# Patient Record
Sex: Male | Born: 1980
Health system: Southern US, Community
[De-identification: ages and names within clinical notes are randomized; demographics above are authoritative.]

## PROBLEM LIST (undated history)

## (undated) DIAGNOSIS — D751 Secondary polycythemia: Secondary | ICD-10-CM

## (undated) DIAGNOSIS — I10 Essential (primary) hypertension: Secondary | ICD-10-CM

## (undated) HISTORY — PX: DENTAL SURGERY: SHX609

## (undated) HISTORY — DX: Secondary polycythemia: D75.1

---

## 2010-10-03 ENCOUNTER — Emergency Department (HOSPITAL_COMMUNITY): Payer: Self-pay

## 2010-10-03 ENCOUNTER — Emergency Department (HOSPITAL_COMMUNITY)
Admission: EM | Admit: 2010-10-03 | Discharge: 2010-10-03 | Disposition: A | Discharge status: Home / Self Care | Payer: Self-pay | Attending: Emergency Medicine | Admitting: Emergency Medicine

## 2010-10-03 DIAGNOSIS — M79609 Pain in unspecified limb: Secondary | ICD-10-CM | POA: Insufficient documentation

## 2010-10-03 DIAGNOSIS — X58XXXA Exposure to other specified factors, initial encounter: Secondary | ICD-10-CM | POA: Insufficient documentation

## 2010-10-03 DIAGNOSIS — S90129A Contusion of unspecified lesser toe(s) without damage to nail, initial encounter: Secondary | ICD-10-CM | POA: Insufficient documentation

## 2010-10-03 DIAGNOSIS — Y929 Unspecified place or not applicable: Secondary | ICD-10-CM | POA: Insufficient documentation

## 2010-10-03 DIAGNOSIS — M7989 Other specified soft tissue disorders: Secondary | ICD-10-CM | POA: Insufficient documentation

## 2014-05-24 ENCOUNTER — Encounter (HOSPITAL_COMMUNITY): Payer: Self-pay | Admitting: Emergency Medicine

## 2014-05-24 ENCOUNTER — Emergency Department (HOSPITAL_COMMUNITY): Payer: Self-pay

## 2014-05-24 ENCOUNTER — Inpatient Hospital Stay (HOSPITAL_COMMUNITY)
Admission: EM | Admit: 2014-05-24 | Discharge: 2014-06-01 | DRG: 728 | Disposition: A | Discharge status: Home / Self Care | Payer: Self-pay | Attending: Internal Medicine | Admitting: Internal Medicine

## 2014-05-24 DIAGNOSIS — D751 Secondary polycythemia: Secondary | ICD-10-CM | POA: Diagnosis present

## 2014-05-24 DIAGNOSIS — Z823 Family history of stroke: Secondary | ICD-10-CM

## 2014-05-24 DIAGNOSIS — M109 Gout, unspecified: Secondary | ICD-10-CM | POA: Diagnosis present

## 2014-05-24 DIAGNOSIS — L729 Follicular cyst of the skin and subcutaneous tissue, unspecified: Secondary | ICD-10-CM

## 2014-05-24 DIAGNOSIS — D45 Polycythemia vera: Secondary | ICD-10-CM | POA: Diagnosis present

## 2014-05-24 DIAGNOSIS — D471 Chronic myeloproliferative disease: Secondary | ICD-10-CM | POA: Diagnosis present

## 2014-05-24 DIAGNOSIS — M10072 Idiopathic gout, left ankle and foot: Secondary | ICD-10-CM | POA: Diagnosis present

## 2014-05-24 DIAGNOSIS — F17290 Nicotine dependence, other tobacco product, uncomplicated: Secondary | ICD-10-CM | POA: Diagnosis present

## 2014-05-24 DIAGNOSIS — R51 Headache: Secondary | ICD-10-CM | POA: Diagnosis present

## 2014-05-24 DIAGNOSIS — L723 Sebaceous cyst: Secondary | ICD-10-CM | POA: Diagnosis present

## 2014-05-24 DIAGNOSIS — I10 Essential (primary) hypertension: Secondary | ICD-10-CM | POA: Diagnosis present

## 2014-05-24 DIAGNOSIS — Z791 Long term (current) use of non-steroidal anti-inflammatories (NSAID): Secondary | ICD-10-CM

## 2014-05-24 DIAGNOSIS — Z833 Family history of diabetes mellitus: Secondary | ICD-10-CM

## 2014-05-24 DIAGNOSIS — M722 Plantar fascial fibromatosis: Secondary | ICD-10-CM | POA: Diagnosis present

## 2014-05-24 DIAGNOSIS — N492 Inflammatory disorders of scrotum: Principal | ICD-10-CM | POA: Diagnosis present

## 2014-05-24 DIAGNOSIS — M25572 Pain in left ankle and joints of left foot: Secondary | ICD-10-CM

## 2014-05-24 DIAGNOSIS — Z23 Encounter for immunization: Secondary | ICD-10-CM

## 2014-05-24 DIAGNOSIS — C946 Myelodysplastic disease, not classified: Secondary | ICD-10-CM | POA: Diagnosis present

## 2014-05-24 DIAGNOSIS — E860 Dehydration: Secondary | ICD-10-CM | POA: Diagnosis present

## 2014-05-24 HISTORY — DX: Essential (primary) hypertension: I10

## 2014-05-24 LAB — CBC WITH DIFFERENTIAL/PLATELET
BASOS PCT: 1 % (ref 0–1)
Basophils Absolute: 0.2 10*3/uL — ABNORMAL HIGH (ref 0.0–0.1)
EOS PCT: 2 % (ref 0–5)
Eosinophils Absolute: 0.3 10*3/uL (ref 0.0–0.7)
HEMATOCRIT: 67.4 % — AB (ref 39.0–52.0)
HEMOGLOBIN: 22.4 g/dL — AB (ref 13.0–17.0)
LYMPHS ABS: 0.9 10*3/uL (ref 0.7–4.0)
Lymphocytes Relative: 5 % — ABNORMAL LOW (ref 12–46)
MCH: 24.9 pg — ABNORMAL LOW (ref 26.0–34.0)
MCHC: 33.2 g/dL (ref 30.0–36.0)
MCV: 74.8 fL — AB (ref 78.0–100.0)
MONOS PCT: 6 % (ref 3–12)
Monocytes Absolute: 1 10*3/uL (ref 0.1–1.0)
Neutro Abs: 14.8 10*3/uL — ABNORMAL HIGH (ref 1.7–7.7)
Neutrophils Relative %: 86 % — ABNORMAL HIGH (ref 43–77)
PLATELETS: 307 10*3/uL (ref 150–400)
RBC: 9.01 MIL/uL — AB (ref 4.22–5.81)
RDW: 20 % — ABNORMAL HIGH (ref 11.5–15.5)
WBC: 17.2 10*3/uL — AB (ref 4.0–10.5)

## 2014-05-24 LAB — COMPREHENSIVE METABOLIC PANEL
ALT: 26 U/L (ref 0–53)
ANION GAP: 11 (ref 5–15)
AST: 19 U/L (ref 0–37)
Albumin: 2.5 g/dL — ABNORMAL LOW (ref 3.5–5.2)
Alkaline Phosphatase: 158 U/L — ABNORMAL HIGH (ref 39–117)
BILIRUBIN TOTAL: 1 mg/dL (ref 0.3–1.2)
BUN: 11 mg/dL (ref 6–23)
CALCIUM: 8.9 mg/dL (ref 8.4–10.5)
CHLORIDE: 100 meq/L (ref 96–112)
CO2: 26 meq/L (ref 19–32)
CREATININE: 0.9 mg/dL (ref 0.50–1.35)
Glucose, Bld: 176 mg/dL — ABNORMAL HIGH (ref 70–99)
Potassium: 4.6 mEq/L (ref 3.7–5.3)
Sodium: 137 mEq/L (ref 137–147)
Total Protein: 6.5 g/dL (ref 6.0–8.3)

## 2014-05-24 LAB — URINALYSIS, ROUTINE W REFLEX MICROSCOPIC
Bilirubin Urine: NEGATIVE
Glucose, UA: 100 mg/dL — AB
KETONES UR: NEGATIVE mg/dL
LEUKOCYTES UA: NEGATIVE
NITRITE: NEGATIVE
Specific Gravity, Urine: 1.037 — ABNORMAL HIGH (ref 1.005–1.030)
UROBILINOGEN UA: 1 mg/dL (ref 0.0–1.0)
pH: 6 (ref 5.0–8.0)

## 2014-05-24 LAB — URINE MICROSCOPIC-ADD ON

## 2014-05-24 MED ORDER — SODIUM CHLORIDE 0.9 % IV SOLN
1250.0000 mg | Freq: Two times a day (BID) | INTRAVENOUS | Status: DC
  Administered 2014-05-25 – 2014-05-26 (×4): 1250 mg via INTRAVENOUS
  Filled 2014-05-24 (×4): qty 1250

## 2014-05-24 MED ORDER — SODIUM CHLORIDE 0.9 % IJ SOLN: 3.0000 mL | INTRAMUSCULAR | Status: DC | PRN

## 2014-05-24 MED ORDER — PIPERACILLIN-TAZOBACTAM 3.375 G IVPB: 3.3750 g | Freq: Once | INTRAVENOUS | Status: DC

## 2014-05-24 MED ORDER — ACETAMINOPHEN 650 MG RE SUPP: 650.0000 mg | Freq: Four times a day (QID) | RECTAL | Status: DC | PRN

## 2014-05-24 MED ORDER — ACETAMINOPHEN 325 MG PO TABS
650.0000 mg | ORAL_TABLET | Freq: Four times a day (QID) | ORAL | Status: DC | PRN
  Administered 2014-05-25 – 2014-05-30 (×2): 650 mg via ORAL
  Filled 2014-05-24 (×2): qty 2

## 2014-05-24 MED ORDER — SODIUM CHLORIDE 0.9 % IV SOLN: 250.0000 mL | INTRAVENOUS | Status: DC | PRN

## 2014-05-24 MED ORDER — INFLUENZA VAC SPLIT QUAD 0.5 ML IM SUSY
0.5000 mL | PREFILLED_SYRINGE | INTRAMUSCULAR | Status: AC
  Administered 2014-05-25: 0.5 mL via INTRAMUSCULAR
  Filled 2014-05-24 (×2): qty 0.5

## 2014-05-24 MED ORDER — MORPHINE SULFATE 2 MG/ML IJ SOLN: 1.0000 mg | INTRAMUSCULAR | Status: DC | PRN

## 2014-05-24 MED ORDER — HEPARIN SODIUM (PORCINE) 5000 UNIT/ML IJ SOLN
5000.0000 [IU] | Freq: Three times a day (TID) | INTRAMUSCULAR | Status: DC
  Administered 2014-05-24 – 2014-06-01 (×18): 5000 [IU] via SUBCUTANEOUS
  Filled 2014-05-24 (×26): qty 1

## 2014-05-24 MED ORDER — PIPERACILLIN-TAZOBACTAM 3.375 G IVPB
3.3750 g | Freq: Once | INTRAVENOUS | Status: AC
Start: 2014-05-24 — End: 2014-05-24
  Administered 2014-05-24: 3.375 g via INTRAVENOUS
  Filled 2014-05-24: qty 50

## 2014-05-24 MED ORDER — VANCOMYCIN HCL 10 G IV SOLR
1500.0000 mg | Freq: Once | INTRAVENOUS | Status: AC
  Administered 2014-05-24: 1500 mg via INTRAVENOUS
  Filled 2014-05-24: qty 1500

## 2014-05-24 MED ORDER — VANCOMYCIN HCL IN DEXTROSE 1-5 GM/200ML-% IV SOLN
1000.0000 mg | Freq: Once | INTRAVENOUS | Status: AC
  Administered 2014-05-24: 1000 mg via INTRAVENOUS
  Filled 2014-05-24: qty 200

## 2014-05-24 MED ORDER — SODIUM CHLORIDE 0.9 % IJ SOLN
3.0000 mL | Freq: Two times a day (BID) | INTRAMUSCULAR | Status: DC
  Administered 2014-05-26 – 2014-05-30 (×8): 3 mL via INTRAVENOUS
  Administered 2014-05-31: 12:00:00 via INTRAVENOUS
  Administered 2014-05-31 – 2014-06-01 (×2): 3 mL via INTRAVENOUS

## 2014-05-24 MED ORDER — IOHEXOL 300 MG/ML  SOLN
100.0000 mL | Freq: Once | INTRAMUSCULAR | Status: AC | PRN
  Administered 2014-05-24: 100 mL via INTRAVENOUS

## 2014-05-24 MED ORDER — PIPERACILLIN-TAZOBACTAM 3.375 G IVPB
3.3750 g | Freq: Three times a day (TID) | INTRAVENOUS | Status: DC
  Administered 2014-05-25 – 2014-05-28 (×11): 3.375 g via INTRAVENOUS
  Filled 2014-05-24 (×12): qty 50

## 2014-05-24 MED ORDER — OXYCODONE HCL 5 MG PO TABS
5.0000 mg | ORAL_TABLET | ORAL | Status: DC | PRN
  Administered 2014-05-24 – 2014-06-01 (×15): 5 mg via ORAL
  Filled 2014-05-24 (×15): qty 1

## 2014-05-24 NOTE — ED Notes (Signed)
Pt reports swelling and redness at base of testicles, increasing over last three days. Pt suspects an ingrown hair. Denies hx of same

## 2014-05-24 NOTE — ED Notes (Signed)
Patient transported to CT 

## 2014-05-24 NOTE — H&P (Signed)
[facility] History and Physical  Robert Avila XTK:[id number] DOB: 08-23-1980 DOA: 05/24/2014  Referring physician/ED personnel: Irena Cords PCP: No primary provider on file.   Chief Complaint: Scrotal discomfort  HPI: Robert Avila is a 33 y.o. male  Generally healthy with no history of diabetes who presented with platelets above. The problem has been present for the last 2 days. Since onset the problem has gotten worse and now the erythema which started out as what appeared to be an ingrown hair has spread to his scrotum. Nothing he is aware of makes it better direct pressure makes it worse.  He denies any fevers.  ED evaluation after CT scan did not report any abscesses. Given that her problem is mainly cellulitis were consulted for further medical evaluation and recommendations.   Review of Systems:  Constitutional:  No weight loss, night sweats, Fevers, chills, fatigue.  HEENT:  No headaches, Difficulty swallowing,Tooth/dental problems,Sore throat,  No sneezing, itching, ear ache, nasal congestion, post nasal drip,  Cardio-vascular:  No chest pain, Orthopnea, PND, swelling in lower extremities, anasarca, dizziness, palpitations  GI:  No heartburn, indigestion, abdominal pain, nausea, vomiting, diarrhea, change in bowel habits, loss of appetite  Resp:  No shortness of breath with exertion or at rest. No excess mucus, no productive cough, No non-productive cough, No coughing up of blood.No change in color of mucus.No wheezing.No chest wall deformity  Skin:  + rash (at scrotum) or lesions.  GU:  no dysuria, change in color of urine, no urgency or frequency. No flank pain.  Musculoskeletal:  No joint pain or swelling. No decreased range of motion. No back pain.  Psych:  No change in mood or affect. No depression or anxiety. No memory loss.   Past Medical History  Diagnosis Date  . Hypertension    Past Surgical History  Procedure Laterality Date  . Dental surgery      Social History:  reports that he has been smoking.  He does not have any smokeless tobacco history on file. He reports that he drinks alcohol. His drug history is not on file.  No Known Allergies  Family History  Problem Relation Age of Onset  . Diabetes Father      Prior to Admission medications   Medication Sig Start Date End Date Taking? Authorizing Provider  acetaminophen (TYLENOL) 500 MG tablet Take 1,000-1,500 mg by mouth every 6 (six) hours as needed for mild pain.   Yes Historical Provider, MD  DM-Doxylamine-Acetaminophen (VICKS NYQUIL COLD & FLU) 15-6.25-325 MG/15ML LIQD Take 30 mLs by mouth at bedtime as needed (for sleep and cold).   Yes Historical Provider, MD  ibuprofen (ADVIL,MOTRIN) 200 MG tablet Take 600 mg by mouth every 6 (six) hours as needed for moderate pain.   Yes Historical Provider, MD   Physical Exam: Filed Vitals:   05/24/14 1005 05/24/14 1014  BP: 147/97   Pulse: 110 108  Temp: 98.8 F (37.1 C)   TempSrc: Oral   Resp: 20   SpO2: 92% 93%    Wt Readings from Last 3 Encounters:  No data found for Wt    General:  Appears calm and comfortable Eyes: PERRL, normal lids, irises & conjunctiva ENT: grossly normal hearing, lips & tongue Neck: no LAD, masses or thyromegaly Cardiovascular: RRR, no m/r/g. No LE edema. Respiratory: CTA bilaterally, no w/r/r. Normal respiratory effort. Abdomen: soft, nt, nd Skin: Scrotal errythema with pain on palpation. No purulent discharge on evaluation Musculoskeletal: grossly normal tone BUE/BLE Psychiatric: grossly normal mood  and affect, speech fluent and appropriate Neurologic: grossly non-focal.          Labs on Admission:  Basic Metabolic Panel:  Recent Labs Lab 05/24/14 1056  NA 137  K 4.6  CL 100  CO2 26  GLUCOSE 176*  BUN 11  CREATININE 0.90  CALCIUM 8.9   Liver Function Tests:  Recent Labs Lab 05/24/14 1056  AST 19  ALT 26  ALKPHOS 158*  BILITOT 1.0  PROT 6.5  ALBUMIN 2.5*   No  results found for this basename: LIPASE, AMYLASE,  in the last 168 hours No results found for this basename: AMMONIA,  in the last 168 hours CBC:  Recent Labs Lab 05/24/14 1055 05/24/14 1056  WBC 17.2* QUESTIONABLE RESULTS, RECOMMEND RECOLLECT TO VERIFY  NEUTROABS 14.8* QUESTIONABLE RESULTS, RECOMMEND RECOLLECT TO VERIFY  HGB 22.4* QUESTIONABLE RESULTS, RECOMMEND RECOLLECT TO VERIFY  HCT 67.4* QUESTIONABLE RESULTS, RECOMMEND RECOLLECT TO VERIFY  MCV 74.8* QUESTIONABLE RESULTS, RECOMMEND RECOLLECT TO VERIFY  PLT 307 QUESTIONABLE RESULTS, RECOMMEND RECOLLECT TO VERIFY   Cardiac Enzymes: No results found for this basename: CKTOTAL, CKMB, CKMBINDEX, TROPONINI,  in the last 168 hours  BNP (last 3 results) No results found for this basename: PROBNP,  in the last 8760 hours CBG: No results found for this basename: GLUCAP,  in the last 168 hours  Radiological Exams on Admission: Ct Pelvis W Contrast  05/24/2014   CLINICAL DATA:  Scrotal and groin swelling for the past 3 days. Clinical concern for abscess. This began 4 days ago. Some fever and chills.  EXAM: CT PELVIS WITH CONTRAST  TECHNIQUE: Multidetector CT imaging of the pelvis was performed using the standard protocol following the bolus administration of intravenous contrast.  CONTRAST:  161mL OMNIPAQUE IOHEXOL 300 MG/ML  SOLN  COMPARISON:  None.  FINDINGS: Diffuse low density scrotal skin thickening. The testicles and epididymi have normal CT appearances. No discrete fluid collections are seen. No intestinal abnormalities. Normal appearing appendix, urinary bladder and prostate gland. Mildly prominent bilateral inguinal lymph nodes. The largest is on the left, with a short axis diameter of 12 mm on image number 47. This has a preserved fatty hilum. Unremarkable bones.  IMPRESSION: Diffuse scrotal cellulitis without abscess.   Electronically Signed   By: Enrique Sack M.D.   On: 05/24/2014 12:45    Assessment/Plan Active Problems:    Cellulitis of scrotum -  Will start patient on Vancomycin and Zosyn - Monitor WBC levels - blood cultures - No purulent drainage on exam or drainable abscess per CT scan - Med Surg for observation, with improvement in condition will plan on transitioning to oral regimen.   Code Status: full DVT Prophylaxis: Heparin Family Communication: d/c spouse at bedside Disposition Plan: obs, med surg  Time spent: > 35 minutes  Velvet Bathe [facility] Pager [phone]

## 2014-05-24 NOTE — ED Provider Notes (Signed)
MSE was initiated and I personally evaluated the patient and placed orders (if any) at  10:37 AM on May 24, 2014.  The patient appears stable so that the remainder of the MSE may be completed by another provider. Patient presents with acute scrotal swelling and erythema after "popping an ingrown hair" around his perineum yesterday evening. Pain is relieved with lying down, worse with sitting up. No dysuria or penile discharge. Patient will be transferred to main ED for further evaluation and to rule out acute scrotum. Basic labs ordered.        Verl Dicker, PA-C 05/24/14 1040

## 2014-05-24 NOTE — ED Notes (Signed)
Verified with Dr. Wendee Beavers that pt does not need a telemetry bed r/t his tachycardia, pt cleared for med surg bed.

## 2014-05-24 NOTE — ED Provider Notes (Signed)
CSN: [id number]     Arrival date & time 05/24/14  9924 History   First MD Initiated Contact with Patient 05/24/14 1036     Chief Complaint  Patient presents with  . Abscess    pt stated that he has a red , swollen area at base of testicles x 3 days  . Groin Swelling  . Excessive Sweating  . Cellulitis    testicles and groin area     (Consider location/radiation/quality/duration/timing/severity/associated sxs/prior Treatment) HPI Patient presents to the emergency department with swelling and redness of the scrotum and perineal area.  Patient, states, that he had an immigrant here in in the peroneal region that he attempted to squeeze and drain.  Patient, states, some purulent material did drain from the area, but then woke up Saturday morning with increased, redness, swelling and pain to the scrotum and perineal area.  The patient denies chest pain, shortness breath, weakness, dizziness, lightheadedness, fever, or rash, syncope, near syncope, or dysuria.  Patient, states he did not take any medications other than ibuprofen prior to arrival. Past Medical History  Diagnosis Date  . Hypertension    Past Surgical History  Procedure Laterality Date  . Dental surgery     Family History  Problem Relation Age of Onset  . Diabetes Father    History  Substance Use Topics  . Smoking status: Current Some Day Smoker  . Smokeless tobacco: Not on file  . Alcohol Use: Yes     Comment: occ    Review of Systems   All other systems negative except as documented in the HPI. All pertinent positives and negatives as reviewed in the HPI. Allergies  Review of patient's allergies indicates no known allergies.  Home Medications   Prior to Admission medications   Medication Sig Start Date End Date Taking? Authorizing Provider  acetaminophen (TYLENOL) 500 MG tablet Take 1,000-1,500 mg by mouth every 6 (six) hours as needed for mild pain.   Yes Historical Provider, MD  DM-Doxylamine-Acetaminophen  (VICKS NYQUIL COLD & FLU) 15-6.25-325 MG/15ML LIQD Take 30 mLs by mouth at bedtime as needed (for sleep and cold).   Yes Historical Provider, MD  ibuprofen (ADVIL,MOTRIN) 200 MG tablet Take 600 mg by mouth every 6 (six) hours as needed for moderate pain.   Yes Historical Provider, MD   BP 147/97  Pulse 108  Temp(Src) 98.8 F (37.1 C) (Oral)  Resp 20  SpO2 93% Physical Exam  Nursing note and vitals reviewed. Constitutional: He appears well-developed and well-nourished. No distress.  HENT:  Head: Normocephalic and atraumatic.  Mouth/Throat: Oropharynx is clear and moist.  Eyes: Pupils are equal, round, and reactive to light.  Neck: Normal range of motion. Neck supple.  Cardiovascular: Normal rate, regular rhythm, normal heart sounds and intact distal pulses.  Exam reveals no gallop and no friction rub.   No murmur heard. Pulmonary/Chest: Effort normal and breath sounds normal.  Skin: Skin is warm and dry.  Patient has redness and swelling of the scrotum with swelling also in the peroneal region    ED Course  Procedures (including critical care time) Labs Review Labs Reviewed  COMPREHENSIVE METABOLIC PANEL - Abnormal; Notable for the following:    Glucose, Bld 176 (*)    Albumin 2.5 (*)    Alkaline Phosphatase 158 (*)    All other components within normal limits  CBC WITH DIFFERENTIAL - Abnormal; Notable for the following:    WBC 17.2 (*)    RBC 9.01 (*)  Hemoglobin 22.4 (*)    HCT 67.4 (*)    MCV 74.8 (*)    MCH 24.9 (*)    RDW 20.0 (*)    Neutrophils Relative % 86 (*)    Lymphocytes Relative 5 (*)    Neutro Abs 14.8 (*)    Basophils Absolute 0.2 (*)    All other components within normal limits  URINE CULTURE  CBC WITH DIFFERENTIAL  URINALYSIS, ROUTINE W REFLEX MICROSCOPIC    Imaging Review Ct Pelvis W Contrast  05/24/2014   CLINICAL DATA:  Scrotal and groin swelling for the past 3 days. Clinical concern for abscess. This began 4 days ago. Some fever and chills.   EXAM: CT PELVIS WITH CONTRAST  TECHNIQUE: Multidetector CT imaging of the pelvis was performed using the standard protocol following the bolus administration of intravenous contrast.  CONTRAST:  17mL OMNIPAQUE IOHEXOL 300 MG/ML  SOLN  COMPARISON:  None.  FINDINGS: Diffuse low density scrotal skin thickening. The testicles and epididymi have normal CT appearances. No discrete fluid collections are seen. No intestinal abnormalities. Normal appearing appendix, urinary bladder and prostate gland. Mildly prominent bilateral inguinal lymph nodes. The largest is on the left, with a short axis diameter of 12 mm on image number 47. This has a preserved fatty hilum. Unremarkable bones.  IMPRESSION: Diffuse scrotal cellulitis without abscess.   Electronically Signed   By: Enrique Sack M.D.   On: 05/24/2014 12:45    Patient admitted to the hospital for further evaluation and care.  Vancomycin and Zosyn were given.    Brent General, PA-C 05/24/14 1451

## 2014-05-24 NOTE — Progress Notes (Signed)
ANTIBIOTIC CONSULT NOTE - INITIAL  Pharmacy Consult for vancomycin, Zosyn Indication: scrotal cellulitis  No Known Allergies  Patient Measurements: Height: 6\' 1"  (185.4 cm) Weight: 275 lb 6.4 oz (124.921 kg) IBW/kg (Calculated) : 79.9  Vital Signs: Temp: 99.2 F (37.3 C) (10/18 1629) Temp Source: Oral (10/18 1629) BP: 163/88 mmHg (10/18 1629) Pulse Rate: 110 (10/18 1629) Intake/Output from previous day:   Intake/Output from this shift:    Labs:  Recent Labs  05/24/14 1055 05/24/14 1056  WBC 17.2* QUESTIONABLE RESULTS, RECOMMEND RECOLLECT TO VERIFY  HGB 22.4* QUESTIONABLE RESULTS, RECOMMEND RECOLLECT TO VERIFY  PLT 307 QUESTIONABLE RESULTS, RECOMMEND RECOLLECT TO VERIFY  CREATININE  --  0.90   Estimated Creatinine Clearance: 161.7 ml/min (by C-G formula based on Cr of 0.9). No results found for this basename: VANCOTROUGH, VANCOPEAK, VANCORANDOM, GENTTROUGH, GENTPEAK, GENTRANDOM, TOBRATROUGH, TOBRAPEAK, TOBRARND, AMIKACINPEAK, AMIKACINTROU, AMIKACIN,  in the last 72 hours   Microbiology: No results found for this or any previous visit (from the past 720 hour(s)).  Medical History: Past Medical History  Diagnosis Date  . Hypertension     Medications:  Scheduled:  . heparin  5,000 Units Subcutaneous 3 times per day  . [START ON 05/25/2014] Influenza vac split quadrivalent PF  0.5 mL Intramuscular Tomorrow-1000  . piperacillin-tazobactam (ZOSYN)  IV  3.375 g Intravenous Once  . sodium chloride  3 mL Intravenous Q12H   Infusions:    Assessment: 33 yo presented to ER with scrotal discomfort found to have scrotal cellulitis to treat with vancomycin and Zosyn per pharmacy dosing.  10/18 >> vancomycin >> 10/18 >> Zosyn >>   Afebrile  WBC pending  SCr 0.9, CrCl N > 100  Goal of Therapy:  Vancomycin trough level 10-15 mcg/ml  Plan:  1) Vancomycin 1g x 1 given in ER. Will give another 1500mg  IV x 1 to = 2500mg  IV total for loading dose then 2) Vancomycin  1250mg  IV q12 thereafter 3) Zosyn 3.375g IV q8 (extended interval infusion)   Adrian Saran, PharmD, BCPS Pager [phone] 05/24/2014 5:22 PM

## 2014-05-25 DIAGNOSIS — N492 Inflammatory disorders of scrotum: Principal | ICD-10-CM

## 2014-05-25 LAB — CBC
HCT: 71.2 % — ABNORMAL HIGH (ref 39.0–52.0)
HEMATOCRIT: 70.6 % — AB (ref 39.0–52.0)
HEMOGLOBIN: 23.2 g/dL — AB (ref 13.0–17.0)
HEMOGLOBIN: 23 g/dL — AB (ref 13.0–17.0)
MCH: 24.3 pg — AB (ref 26.0–34.0)
MCH: 24.4 pg — ABNORMAL LOW (ref 26.0–34.0)
MCHC: 32.6 g/dL (ref 30.0–36.0)
MCHC: 33.1 g/dL (ref 30.0–36.0)
MCV: 74.6 fL — ABNORMAL LOW (ref 78.0–100.0)
MCV: 73.5 fL — ABNORMAL LOW (ref 78.0–100.0)
Platelets: 269 10*3/uL (ref 150–400)
Platelets: 284 10*3/uL (ref 150–400)
RBC: 9.54 MIL/uL — AB (ref 4.22–5.81)
RBC: 9.5 MIL/uL — ABNORMAL HIGH (ref 4.22–5.81)
RDW: 18.9 % — ABNORMAL HIGH (ref 11.5–15.5)
RDW: 19.9 % — ABNORMAL HIGH (ref 11.5–15.5)
WBC: 14.3 10*3/uL — AB (ref 4.0–10.5)
WBC: 13.6 10*3/uL — AB (ref 4.0–10.5)

## 2014-05-25 NOTE — Progress Notes (Signed)
Lab phoned to state 10/19 CBC results questionable, state they will correct.

## 2014-05-25 NOTE — ED Provider Notes (Signed)
  This was a shared visit with a mid-level provided (NP or PA).  Throughout the patient's course I was available for consultation/collaboration.  I saw the ECG (if appropriate), relevant labs and studies - I agree with the interpretation.  On my exam the patient was in no distress.  The patient had evidence for scrotal cellulitis.  Patient was started on antibiotics, admitted for further evaluation and management.       Carmin Muskrat, MD 05/25/14 [phone]

## 2014-05-25 NOTE — Progress Notes (Signed)
CSW received referral for "other".   CSW spoke with RN who reports that pt needing assistance with medication and does not have insurance or doctor. RNCM aware and will see pt regarding concerns.  Inappropriate CSW referral.  CSW signing off.   Please re-consult if social work needs arise.  Alison Murray, MSW, [place] [facility] [phone]

## 2014-05-25 NOTE — Progress Notes (Signed)
[facility] PROGRESS NOTE  Robert Avila OEU:[id number] DOB: 08-27-1980 DOA: 05/24/2014 PCP: No primary provider on file.  Assessment/Plan: Cellulitis of scrotum  - Will continue Vancomycin and Zosyn  - Monitor WBC levels which are currently trending down - blood cultures pending - No purulent drainage on exam or drainable abscess per CT scan   Polycythemia - The patient may be dehydrated given recent reports of increased use of caffeine products. - Recommend cessation of caffeine, we'll reassess next a.m.   Code Status: full Family Communication: None at bedside Disposition Plan:    Consultants:  None  Procedures:  None  Antibiotics:  As listed above  HPI/Subjective: Patient has no new complaints, feels minimally better  Objective: Filed Vitals:   05/25/14 1409  BP: 145/99  Pulse: 91  Temp: 98.1 F (36.7 C)  Resp: 18    Intake/Output Summary (Last 24 hours) at 05/25/14 1731 Last data filed at 05/25/14 1300  Gross per 24 hour  Intake   1010 ml  Output      0 ml  Net   1010 ml   Filed Weights   05/24/14 1629  Weight: 124.921 kg (275 lb 6.4 oz)    Exam:   General:  Patient in no acute distress, alert and awake  Cardiovascular: Regular rate and rhythm, no murmurs or rubs  Respiratory: Clear to auscultation bilaterally no wheezes  Abdomen: Soft, nondistended, nontender  GU: Scrotum is enlarged and erythematous, no purulent discharge, painful on palpation, no induration or fluctuance  Musculoskeletal: No cyanosis or clubbing  Data Reviewed: Basic Metabolic Panel:  Recent Labs Lab 05/24/14 1056  NA 137  K 4.6  CL 100  CO2 26  GLUCOSE 176*  BUN 11  CREATININE 0.90  CALCIUM 8.9   Liver Function Tests:  Recent Labs Lab 05/24/14 1056  AST 19  ALT 26  ALKPHOS 158*  BILITOT 1.0  PROT 6.5  ALBUMIN 2.5*   No results found for this basename: LIPASE, AMYLASE,  in the last 168 hours No results found for this basename: AMMONIA,   in the last 168 hours CBC:  Recent Labs Lab 05/24/14 1055 05/24/14 1056 05/25/14 0406 05/25/14 0940  WBC 17.2* QUESTIONABLE RESULTS, RECOMMEND RECOLLECT TO VERIFY 14.3* 13.6*  NEUTROABS 14.8* QUESTIONABLE RESULTS, RECOMMEND RECOLLECT TO VERIFY  --   --   HGB 22.4* QUESTIONABLE RESULTS, RECOMMEND RECOLLECT TO VERIFY 23.2* 23.0*  HCT 67.4* QUESTIONABLE RESULTS, RECOMMEND RECOLLECT TO VERIFY 71.2* 70.6*  MCV 74.8* QUESTIONABLE RESULTS, RECOMMEND RECOLLECT TO VERIFY 74.6* 73.5*  PLT 307 QUESTIONABLE RESULTS, RECOMMEND RECOLLECT TO VERIFY 269 284   Cardiac Enzymes: No results found for this basename: CKTOTAL, CKMB, CKMBINDEX, TROPONINI,  in the last 168 hours BNP (last 3 results) No results found for this basename: PROBNP,  in the last 8760 hours CBG: No results found for this basename: GLUCAP,  in the last 168 hours  Recent Results (from the past 240 hour(s))  CULTURE, BLOOD (ROUTINE X 2)     Status: None   Collection Time    05/24/14  4:07 PM      Result Value Ref Range Status   Specimen Description BLOOD RIGHT HAND   Final   Special Requests     Final   Value: BOTTLES DRAWN AEROBIC AND ANAEROBIC 4CC BOTH BOTTLES   Culture  Setup Time     Final   Value: 05/24/2014 18:50     Performed at [facility]     Final  Value:        BLOOD CULTURE RECEIVED NO GROWTH TO DATE CULTURE WILL BE HELD FOR 5 DAYS BEFORE ISSUING A FINAL NEGATIVE REPORT     Performed at [facility]   Report Status PENDING   Incomplete  CULTURE, BLOOD (ROUTINE X 2)     Status: None   Collection Time    05/24/14  4:07 PM      Result Value Ref Range Status   Specimen Description BLOOD RIGHT ARM   Final   Special Requests     Final   Value: BOTTLES DRAWN AEROBIC AND ANAEROBIC 4CC BOTH BOTTLES   Culture  Setup Time     Final   Value: 05/24/2014 18:50     Performed at [facility]   Culture     Final   Value:        BLOOD CULTURE RECEIVED NO GROWTH TO DATE CULTURE WILL BE HELD  FOR 5 DAYS BEFORE ISSUING A FINAL NEGATIVE REPORT     Performed at [facility]   Report Status PENDING   Incomplete     Studies: Ct Pelvis W Contrast  05/24/2014   CLINICAL DATA:  Scrotal and groin swelling for the past 3 days. Clinical concern for abscess. This began 4 days ago. Some fever and chills.  EXAM: CT PELVIS WITH CONTRAST  TECHNIQUE: Multidetector CT imaging of the pelvis was performed using the standard protocol following the bolus administration of intravenous contrast.  CONTRAST:  154mL OMNIPAQUE IOHEXOL 300 MG/ML  SOLN  COMPARISON:  None.  FINDINGS: Diffuse low density scrotal skin thickening. The testicles and epididymi have normal CT appearances. No discrete fluid collections are seen. No intestinal abnormalities. Normal appearing appendix, urinary bladder and prostate gland. Mildly prominent bilateral inguinal lymph nodes. The largest is on the left, with a short axis diameter of 12 mm on image number 47. This has a preserved fatty hilum. Unremarkable bones.  IMPRESSION: Diffuse scrotal cellulitis without abscess.   Electronically Signed   By: Enrique Sack M.D.   On: 05/24/2014 12:45    Scheduled Meds: . heparin  5,000 Units Subcutaneous 3 times per day  . piperacillin-tazobactam (ZOSYN)  IV  3.375 g Intravenous Q8H  . sodium chloride  3 mL Intravenous Q12H  . vancomycin  1,250 mg Intravenous Q12H   Continuous Infusions:   Active Problems:   Cellulitis of scrotum    Time spent: > 25 minutes    Velvet Bathe  [facility] Pager [phone]. If 7PM-7AM, please contact night-coverage at [email or web address], password [facility] 05/25/2014, 5:31 PM  LOS: 1 day

## 2014-05-25 NOTE — Care Management Note (Signed)
CARE MANAGEMENT NOTE 05/25/2014  Patient:  Koehler,Sara   Account Number:  [id number]  Date Initiated:  05/25/2014  Documentation initiated by:  Marney Doctor  Subjective/Objective Assessment:   33 yo admitted with cellulitis of scrotum.     Action/Plan:   From home with wife   Anticipated DC Date:  05/26/2014   Anticipated DC Plan:  [place]  CM consult  [facility]      Choice offered to / List presented to:             Status of service:  In process, will continue to follow Medicare Important Message given?   (If response is "NO", the following Medicare IM given date fields will be blank) Date Medicare IM given:   Medicare IM given by:   Date Additional Medicare IM given:   Additional Medicare IM given by:    Discharge Disposition:    Per UR Regulation:  Reviewed for med. necessity/level of care/duration of stay  If discussed at [place] of Stay Meetings, dates discussed:    Comments:  05/25/14 Marney Doctor RN,BSN,NCM [phone] Met with pt and spouse about DC needs.  Pt has no PCP or insurance.  They are planning to apply for Medicaid.  Gave info on [facility] and told them about their services and potential to help with medication needs if they are scheduled for a follow up appointment.  Pt did not want this CM to make an appointment at this time.  They stated that they could afford DC medications and would think about making an appointment at the [facility].  They were very appreciative of CM assistance.

## 2014-05-25 NOTE — Progress Notes (Signed)
UR completed 

## 2014-05-25 NOTE — Plan of Care (Signed)
Problem: Phase I Progression Outcomes Goal: Hemodynamically stable Outcome: Not Progressing HGB critically high 23.2

## 2014-05-26 LAB — URINE CULTURE
Colony Count: NO GROWTH
Culture: NO GROWTH

## 2014-05-26 LAB — VANCOMYCIN, TROUGH: Vancomycin Tr: 7 ug/mL — ABNORMAL LOW (ref 10.0–20.0)

## 2014-05-26 MED ORDER — VANCOMYCIN HCL 10 G IV SOLR
1500.0000 mg | Freq: Two times a day (BID) | INTRAVENOUS | Status: DC
  Administered 2014-05-27 – 2014-05-28 (×3): 1500 mg via INTRAVENOUS
  Filled 2014-05-26 (×4): qty 1500

## 2014-05-26 MED ORDER — HYDRALAZINE HCL 20 MG/ML IJ SOLN
5.0000 mg | Freq: Once | INTRAMUSCULAR | Status: AC
  Administered 2014-05-26: 5 mg via INTRAVENOUS
  Filled 2014-05-26: qty 1

## 2014-05-26 NOTE — Progress Notes (Signed)
PHARMACY - VANCOMYCIN (brief note)  Day #3 Vancomycin 1250mg  IV q12h for scrotal cellulitis.  Also on Zosyn 3.375gm IV q8h  Vancomycin trough level = 7 mcg/ml (goal 10-15 mcg/ml)  Last dose of Vancomycin 1250mg  charted @ 17:38  Assessment:  Vancomycin SUBtherapeutic on current regimen  PLAN:  Increase Vancomycin to 1500mg  IV q12h with next dose.  Will recheck Vancomycin levels as appropriate.  Leone Haven, PharmD 05/26/14 @ 18:44

## 2014-05-26 NOTE — Progress Notes (Signed)
At 0455 pt's BP was 0455. NP on call Chaney Malling notified and order received for 5 mg of hydralazine which was administered. At 0627 BP 147/102. Schorr NP notified again. Received a repeat order of 5 mg of hydralazine. This RN to continue to monitor. Noreene Larsson RN, BSN

## 2014-05-26 NOTE — Progress Notes (Signed)
ANTIBIOTIC CONSULT NOTE - follow up  Pharmacy Consult for vancomycin, Zosyn Indication: scrotal cellulitis  No Known Allergies  Patient Measurements: Height: 6\' 1"  (185.4 cm) Weight: 275 lb 6.4 oz (124.921 kg) IBW/kg (Calculated) : 79.9  Vital Signs: Temp: 98.7 F (37.1 C) (10/20 0455) Temp Source: Oral (10/20 0455) BP: 147/102 mmHg (10/20 0627) Pulse Rate: 90 (10/20 0627) Intake/Output from previous day: 10/19 0701 - 10/20 0700 In: 530 [P.O.:480; IV Piggyback:50] Out: -  Intake/Output from this shift:    Labs:  Recent Labs  05/24/14 1056 05/25/14 0406 05/25/14 0940  WBC QUESTIONABLE RESULTS, RECOMMEND RECOLLECT TO VERIFY 14.3* 13.6*  HGB QUESTIONABLE RESULTS, RECOMMEND RECOLLECT TO VERIFY 23.2* 23.0*  PLT QUESTIONABLE RESULTS, RECOMMEND RECOLLECT TO VERIFY 269 284  CREATININE 0.90  --   --    Estimated Creatinine Clearance: 161.7 ml/min (by C-G formula based on Cr of 0.9). No results found for this basename: VANCOTROUGH, VANCOPEAK, VANCORANDOM, [place], [place], [place], [place], [place], TOBRARND, AMIKACINPEAK, AMIKACINTROU, AMIKACIN,  in the last 72 hours   Microbiology: Recent Results (from the past 720 hour(s))  CULTURE, BLOOD (ROUTINE X 2)     Status: None   Collection Time    05/24/14  4:07 PM      Result Value Ref Range Status   Specimen Description BLOOD RIGHT HAND   Final   Special Requests     Final   Value: BOTTLES DRAWN AEROBIC AND ANAEROBIC 4CC BOTH BOTTLES   Culture  Setup Time     Final   Value: 05/24/2014 18:50     Performed at [facility]   Culture     Final   Value:        BLOOD CULTURE RECEIVED NO GROWTH TO DATE CULTURE WILL BE HELD FOR 5 DAYS BEFORE ISSUING A FINAL NEGATIVE REPORT     Performed at [facility]   Report Status PENDING   Incomplete  CULTURE, BLOOD (ROUTINE X 2)     Status: None   Collection Time    05/24/14  4:07 PM      Result Value Ref Range Status   Specimen Description BLOOD RIGHT ARM    Final   Special Requests     Final   Value: BOTTLES DRAWN AEROBIC AND ANAEROBIC 4CC BOTH BOTTLES   Culture  Setup Time     Final   Value: 05/24/2014 18:50     Performed at [facility]   Culture     Final   Value:        BLOOD CULTURE RECEIVED NO GROWTH TO DATE CULTURE WILL BE HELD FOR 5 DAYS BEFORE ISSUING A FINAL NEGATIVE REPORT     Performed at [facility]   Report Status PENDING   Incomplete  URINE CULTURE     Status: None   Collection Time    05/24/14  5:59 PM      Result Value Ref Range Status   Specimen Description URINE, CLEAN CATCH   Final   Special Requests NONE   Final   Culture  Setup Time     Final   Value: 05/25/2014 00:16     Performed at [place]     Final   Value: NO GROWTH     Performed at [facility]   Culture     Final   Value: NO GROWTH     Performed at [facility]   Report Status 05/26/2014 FINAL   Final  Medical History: Past Medical History  Diagnosis Date  . Hypertension     Medications:  Scheduled:  . heparin  5,000 Units Subcutaneous 3 times per day  . piperacillin-tazobactam (ZOSYN)  IV  3.375 g Intravenous Q8H  . sodium chloride  3 mL Intravenous Q12H  . vancomycin  1,250 mg Intravenous Q12H   Infusions:    Assessment: 33 yo presented to ER with scrotal discomfort found to have scrotal cellulitis to treat with vancomycin and Zosyn per pharmacy dosing.  10/18 >> vancomycin >> 10/18 >> Zosyn >>   Afebrile  WBC improving  SCr 0.9, CrCl N > 100  Culture: ngtd  Goal of Therapy:  Vancomycin trough level 10-15 mcg/ml  Plan:  1) Continue Vancomycin 1250mg  IV q12 for now 3) Zosyn 3.375g IV q8 (extended interval infusion) 3) Check vanc trough tonight prior to next vanc dose at [place], PharmD, BCPS Pager [phone] 05/26/2014 9:43 AM

## 2014-05-26 NOTE — Progress Notes (Signed)
[facility] PROGRESS NOTE  Robert Avila VVO:[id number] DOB: 20-Oct-1980 DOA: 05/24/2014 PCP: No primary provider on file. Brief Narrative: 33 year old who presented with scrotal cellulitis/edema. Workup was negative for abscess  Assessment/Plan: Cellulitis of scrotum  - Will continue Vancomycin and Zosyn , and improving on this regimen - Monitor WBC levels which are currently trending down, we'll reassess next a.m. - blood cultures pending - No purulent drainage on exam or drainable abscess per CT scan   Polycythemia - The patient may be dehydrated given recent reports of increased use of caffeine products. - Recommend cessation of caffeine, we'll reassess next a.m.   Code Status: full Family Communication: None at bedside Disposition Plan: Discharge within the next one to 2 days with continued improvement   Consultants:  None  Procedures:  None  Antibiotics:  As listed above  HPI/Subjective: Patient complaining of chills. No new complaints otherwise  Objective: Filed Vitals:   05/26/14 1325  BP: 150/97  Pulse: 87  Temp: 98.2 F (36.8 C)  Resp:     Intake/Output Summary (Last 24 hours) at 05/26/14 1431 Last data filed at 05/26/14 1300  Gross per 24 hour  Intake    480 ml  Output      0 ml  Net    480 ml   Filed Weights   05/24/14 1629  Weight: 124.921 kg (275 lb 6.4 oz)    Exam:   General:  Patient in no acute distress, alert and awake  Cardiovascular: Regular rate and rhythm, no murmurs or rubs  Respiratory: Clear to auscultation bilaterally no wheezes  Abdomen: Soft, nondistended, nontender  GU: Scrotum is enlarged and erythematous (improved from admission), no purulent discharge, painful on palpation, no induration or fluctuance  Musculoskeletal: No cyanosis or clubbing  Data Reviewed: Basic Metabolic Panel:  Recent Labs Lab 05/24/14 1056  NA 137  K 4.6  CL 100  CO2 26  GLUCOSE 176*  BUN 11  CREATININE 0.90  CALCIUM 8.9    Liver Function Tests:  Recent Labs Lab 05/24/14 1056  AST 19  ALT 26  ALKPHOS 158*  BILITOT 1.0  PROT 6.5  ALBUMIN 2.5*   No results found for this basename: LIPASE, AMYLASE,  in the last 168 hours No results found for this basename: AMMONIA,  in the last 168 hours CBC:  Recent Labs Lab 05/24/14 1055 05/24/14 1056 05/25/14 0406 05/25/14 0940  WBC 17.2* QUESTIONABLE RESULTS, RECOMMEND RECOLLECT TO VERIFY 14.3* 13.6*  NEUTROABS 14.8* QUESTIONABLE RESULTS, RECOMMEND RECOLLECT TO VERIFY  --   --   HGB 22.4* QUESTIONABLE RESULTS, RECOMMEND RECOLLECT TO VERIFY 23.2* 23.0*  HCT 67.4* QUESTIONABLE RESULTS, RECOMMEND RECOLLECT TO VERIFY 71.2* 70.6*  MCV 74.8* QUESTIONABLE RESULTS, RECOMMEND RECOLLECT TO VERIFY 74.6* 73.5*  PLT 307 QUESTIONABLE RESULTS, RECOMMEND RECOLLECT TO VERIFY 269 284   Cardiac Enzymes: No results found for this basename: CKTOTAL, CKMB, CKMBINDEX, TROPONINI,  in the last 168 hours BNP (last 3 results) No results found for this basename: PROBNP,  in the last 8760 hours CBG: No results found for this basename: GLUCAP,  in the last 168 hours  Recent Results (from the past 240 hour(s))  CULTURE, BLOOD (ROUTINE X 2)     Status: None   Collection Time    05/24/14  4:07 PM      Result Value Ref Range Status   Specimen Description BLOOD RIGHT HAND   Final   Special Requests     Final   Value: BOTTLES DRAWN AEROBIC AND ANAEROBIC 4CC  BOTH BOTTLES   Culture  Setup Time     Final   Value: 05/24/2014 18:50     Performed at [facility]   Culture     Final   Value:        BLOOD CULTURE RECEIVED NO GROWTH TO DATE CULTURE WILL BE HELD FOR 5 DAYS BEFORE ISSUING A FINAL NEGATIVE REPORT     Performed at [facility]   Report Status PENDING   Incomplete  CULTURE, BLOOD (ROUTINE X 2)     Status: None   Collection Time    05/24/14  4:07 PM      Result Value Ref Range Status   Specimen Description BLOOD RIGHT ARM   Final   Special Requests     Final    Value: BOTTLES DRAWN AEROBIC AND ANAEROBIC 4CC BOTH BOTTLES   Culture  Setup Time     Final   Value: 05/24/2014 18:50     Performed at [facility]   Culture     Final   Value:        BLOOD CULTURE RECEIVED NO GROWTH TO DATE CULTURE WILL BE HELD FOR 5 DAYS BEFORE ISSUING A FINAL NEGATIVE REPORT     Performed at [facility]   Report Status PENDING   Incomplete  URINE CULTURE     Status: None   Collection Time    05/24/14  5:59 PM      Result Value Ref Range Status   Specimen Description URINE, CLEAN CATCH   Final   Special Requests NONE   Final   Culture  Setup Time     Final   Value: 05/25/2014 00:16     Performed at [place]     Final   Value: NO GROWTH     Performed at [facility]   Culture     Final   Value: NO GROWTH     Performed at [facility]   Report Status 05/26/2014 FINAL   Final     Studies: No results found.  Scheduled Meds: . heparin  5,000 Units Subcutaneous 3 times per day  . piperacillin-tazobactam (ZOSYN)  IV  3.375 g Intravenous Q8H  . sodium chloride  3 mL Intravenous Q12H  . vancomycin  1,250 mg Intravenous Q12H   Continuous Infusions:   Active Problems:   Cellulitis of scrotum    Time spent: > 25 minutes    Velvet Bathe  [facility] Pager [phone]. If 7PM-7AM, please contact night-coverage at [email or web address], password [facility] 05/26/2014, 2:31 PM  LOS: 2 days

## 2014-05-27 ENCOUNTER — Inpatient Hospital Stay (HOSPITAL_COMMUNITY): Payer: Self-pay

## 2014-05-27 DIAGNOSIS — Z720 Tobacco use: Secondary | ICD-10-CM

## 2014-05-27 DIAGNOSIS — D751 Secondary polycythemia: Secondary | ICD-10-CM

## 2014-05-27 LAB — CBC
HEMATOCRIT: 66.4 % — AB (ref 39.0–52.0)
Hemoglobin: 21.2 g/dL (ref 13.0–17.0)
MCH: 23.8 pg — ABNORMAL LOW (ref 26.0–34.0)
MCHC: 31.9 g/dL (ref 30.0–36.0)
MCV: 74.6 fL — ABNORMAL LOW (ref 78.0–100.0)
PLATELETS: 306 10*3/uL (ref 150–400)
RBC: 8.9 MIL/uL — ABNORMAL HIGH (ref 4.22–5.81)
RDW: 18.7 % — ABNORMAL HIGH (ref 11.5–15.5)
WBC: 11.2 10*3/uL — AB (ref 4.0–10.5)

## 2014-05-27 LAB — RETICULOCYTES
RBC.: 8.62 MIL/uL — AB (ref 4.22–5.81)
RETIC COUNT ABSOLUTE: 56 10*3/uL (ref 19.0–186.0)
Retic Ct Pct: 1.3 % (ref 0.4–3.1)

## 2014-05-27 LAB — SAVE SMEAR

## 2014-05-27 LAB — SEDIMENTATION RATE: Sed Rate: 3 mm/hr (ref 0–16)

## 2014-05-27 MED ORDER — SODIUM CHLORIDE 0.9 % IV SOLN
INTRAVENOUS | Status: AC
  Administered 2014-05-28: 09:00:00 via INTRAVENOUS

## 2014-05-27 MED ORDER — ASPIRIN 81 MG PO CHEW
81.0000 mg | CHEWABLE_TABLET | Freq: Every day | ORAL | Status: DC
  Administered 2014-05-27 – 2014-06-01 (×6): 81 mg via ORAL
  Filled 2014-05-27 (×6): qty 1

## 2014-05-27 MED ORDER — OXYCODONE HCL 5 MG PO TABS
5.0000 mg | ORAL_TABLET | Freq: Once | ORAL | Status: AC
  Administered 2014-05-27: 5 mg via ORAL
  Filled 2014-05-27: qty 1

## 2014-05-27 NOTE — Progress Notes (Signed)
[facility] PROGRESS NOTE  Robert Avila HWT:[id number] DOB: 1981-06-21 DOA: 05/24/2014 PCP: No primary provider on file.  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 Patient denies any headache, visual symptoms, urticaria or rash, abdominal or joint pains. Chest x-rays and LFTs normal. UA unremarkable. Check ESR Spoke with Dr Alvy Bimler who recommended adding low dose ASA ovoid risk for stroke and phlebotomy. Order for phlebotomy per IV team. Monitor CBC closely in am   Scrotal  cellulitis No signs of abscess or hematoma on imaging. Pain and swelling has been improving as her patient. Continue vancomycin and Zosyn. Transition to oral antibiotic upon discharge. Pain control with when necessary morphine and oxycodone.    Code Status: Full code Family Communication: Wife at bedside  Disposition Plan: Home once improved   Consultants:  Hematology ( Dr Alvy Bimler)  Procedures:   phlebotomy  Antibiotics:  IV vancomycin and Zosyn 10/18--  HPI/Subjective: Patient reports some pain in his scrotum. Denies any other symptoms.  Objective: Filed Vitals:   05/27/14 1400  BP: 150/90  Pulse: 90  Temp: 98.2 F (36.8 C)  Resp:     Intake/Output Summary (Last 24 hours) at 05/27/14 1602 Last data filed at 05/27/14 1300  Gross per 24 hour  Intake   1200 ml  Output      0 ml  Net   1200 ml   Filed Weights   05/24/14 1629  Weight: 124.921 kg (275 lb 6.4 oz)    Exam:   General:  Middle aged male in no acute distress  HEENT: No pallor, moist oral mucosa  Chest: Clear to auscultation bilaterally  CVS: Normal S1-S2, no murmurs  Abdomen: Soft, nondistended, nontender, no hepatosplenomegaly  Remedies: Warm, no edema, no calf swelling or tenderness, no petechiae or rash  CNS: Alert and oriented    Data Reviewed: Basic Metabolic Panel:  Recent Labs Lab 05/24/14 1056  NA  137  K 4.6  CL 100  CO2 26  GLUCOSE 176*  BUN 11  CREATININE 0.90  CALCIUM 8.9   Liver Function Tests:  Recent Labs Lab 05/24/14 1056  AST 19  ALT 26  ALKPHOS 158*  BILITOT 1.0  PROT 6.5  ALBUMIN 2.5*   No results found for this basename: LIPASE, AMYLASE,  in the last 168 hours No results found for this basename: AMMONIA,  in the last 168 hours CBC:  Recent Labs Lab 05/24/14 1055 05/24/14 1056 05/25/14 0406 05/25/14 0940 05/27/14  WBC 17.2* QUESTIONABLE RESULTS, RECOMMEND RECOLLECT TO VERIFY 14.3* 13.6* 11.2*  NEUTROABS 14.8* QUESTIONABLE RESULTS, RECOMMEND RECOLLECT TO VERIFY  --   --   --   HGB 22.4* QUESTIONABLE RESULTS, RECOMMEND RECOLLECT TO VERIFY 23.2* 23.0* 21.2*  HCT 67.4* QUESTIONABLE RESULTS, RECOMMEND RECOLLECT TO VERIFY 71.2* 70.6* 66.4*  MCV 74.8* QUESTIONABLE RESULTS, RECOMMEND RECOLLECT TO VERIFY 74.6* 73.5* 74.6*  PLT 307 QUESTIONABLE RESULTS, RECOMMEND RECOLLECT TO VERIFY 269 284 306   Cardiac Enzymes: No results found for this basename: CKTOTAL, CKMB, CKMBINDEX, TROPONINI,  in the last 168 hours BNP (last 3 results) No results found for this basename: PROBNP,  in the last 8760 hours CBG: No results found for this basename: GLUCAP,  in the last 168 hours  Recent Results (from the past 240 hour(s))  CULTURE, BLOOD (ROUTINE X 2)     Status: None   Collection Time    05/24/14  4:07 PM      Result Value Ref Range Status  Specimen Description BLOOD RIGHT HAND   Final   Special Requests     Final   Value: BOTTLES DRAWN AEROBIC AND ANAEROBIC 4CC BOTH BOTTLES   Culture  Setup Time     Final   Value: 05/24/2014 18:50     Performed at [facility]   Culture     Final   Value:        BLOOD CULTURE RECEIVED NO GROWTH TO DATE CULTURE WILL BE HELD FOR 5 DAYS BEFORE ISSUING A FINAL NEGATIVE REPORT     Performed at [facility]   Report Status PENDING   Incomplete  CULTURE, BLOOD (ROUTINE X 2)     Status: None   Collection Time     05/24/14  4:07 PM      Result Value Ref Range Status   Specimen Description BLOOD RIGHT ARM   Final   Special Requests     Final   Value: BOTTLES DRAWN AEROBIC AND ANAEROBIC 4CC BOTH BOTTLES   Culture  Setup Time     Final   Value: 05/24/2014 18:50     Performed at [facility]   Culture     Final   Value:        BLOOD CULTURE RECEIVED NO GROWTH TO DATE CULTURE WILL BE HELD FOR 5 DAYS BEFORE ISSUING A FINAL NEGATIVE REPORT     Performed at [facility]   Report Status PENDING   Incomplete  URINE CULTURE     Status: None   Collection Time    05/24/14  5:59 PM      Result Value Ref Range Status   Specimen Description URINE, CLEAN CATCH   Final   Special Requests NONE   Final   Culture  Setup Time     Final   Value: 05/25/2014 00:16     Performed at [place]     Final   Value: NO GROWTH     Performed at [facility]   Culture     Final   Value: NO GROWTH     Performed at [facility]   Report Status 05/26/2014 FINAL   Final     Studies: Dg Chest 2 View  05/27/2014   CLINICAL DATA:  Cough, shortness of breath.  EXAM: CHEST  2 VIEW  COMPARISON:  None.  FINDINGS: The heart size and mediastinal contours are within normal limits. No pneumothorax or pleural effusion is noted. Right upper lobe airspace opacity is noted concerning for atelectasis or possibly pneumonia. Left lung is clear. The visualized skeletal structures are unremarkable.  IMPRESSION: Right upper lobe airspace opacity is noted concerning for pneumonia or atelectasis. Short-term follow-up radiographs are recommended to ensure resolution to rule out the possibility of underlying neoplasm.   Electronically Signed   By: Sabino Dick M.D.   On: 05/27/2014 11:05    Scheduled Meds: . aspirin  81 mg Oral Daily  . heparin  5,000 Units Subcutaneous 3 times per day  . piperacillin-tazobactam (ZOSYN)  IV  3.375 g Intravenous Q8H  . sodium chloride  3 mL Intravenous Q12H   . vancomycin  1,500 mg Intravenous Q12H   Continuous Infusions:   Active Problems:   Cellulitis of scrotum    Time spent: 25 minutes    Louellen Molder  [facility] Pager [phone] If 7PM-7AM, please contact night-coverage at [email or web address], password [facility] 05/27/2014, 4:02 PM  LOS: 3 days
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
## 2014-05-27 NOTE — Progress Notes (Signed)
556 grams phlebotomized from right AC. Patient tolerated procedure well. Pressure dressing applied. Patient instructed not to remove pressure for 24 hours. Phlebotomy performed by Norville Haggard, RN and Abelina Bachelor, RN

## 2014-05-28 LAB — BASIC METABOLIC PANEL
Anion gap: 14 (ref 5–15)
BUN: 9 mg/dL (ref 6–23)
CALCIUM: 8.9 mg/dL (ref 8.4–10.5)
CO2: 22 mEq/L (ref 19–32)
CREATININE: 0.89 mg/dL (ref 0.50–1.35)
Chloride: 101 mEq/L (ref 96–112)
GFR calc Af Amer: >90 mL/min (ref >90)
GFR calc non Af Amer: >90 mL/min (ref >90)
Glucose, Bld: 154 mg/dL — ABNORMAL HIGH (ref 70–99)
Potassium: 4 mEq/L (ref 3.7–5.3)
Sodium: 137 mEq/L (ref 137–147)

## 2014-05-28 LAB — CBC
HCT: 69.5 % — ABNORMAL HIGH (ref 39.0–52.0)
Hemoglobin: 21.5 g/dL (ref 13.0–17.0)
MCH: 23.8 pg — AB (ref 26.0–34.0)
MCHC: 30.9 g/dL (ref 30.0–36.0)
MCV: 76.8 fL — AB (ref 78.0–100.0)
PLATELETS: 300 10*3/uL (ref 150–400)
RBC: 9.05 MIL/uL — ABNORMAL HIGH (ref 4.22–5.81)
RDW: 18.8 % — AB (ref 11.5–15.5)
WBC: 11.9 10*3/uL — ABNORMAL HIGH (ref 4.0–10.5)

## 2014-05-28 LAB — FERRITIN: Ferritin: 130 ng/mL (ref 22–322)

## 2014-05-28 MED ORDER — HYDRALAZINE HCL 25 MG PO TABS
25.0000 mg | ORAL_TABLET | Freq: Four times a day (QID) | ORAL | Status: DC | PRN
  Filled 2014-05-28: qty 1

## 2014-05-28 MED ORDER — SODIUM CHLORIDE 0.9 % IV BOLUS (SEPSIS)
1000.0000 mL | Freq: Once | INTRAVENOUS | Status: AC
  Administered 2014-05-28: 1000 mL via INTRAVENOUS

## 2014-05-28 MED ORDER — DOXYCYCLINE HYCLATE 100 MG PO TABS
100.0000 mg | ORAL_TABLET | Freq: Two times a day (BID) | ORAL | Status: DC
  Administered 2014-05-28 – 2014-06-01 (×9): 100 mg via ORAL
  Filled 2014-05-28 (×12): qty 1

## 2014-05-28 NOTE — Progress Notes (Addendum)
[facility] PROGRESS NOTE  Robert Avila NLG:[id number] DOB: 02-15-1981 DOA: 05/24/2014 PCP: No primary provider on file.  Brief narrative 33 year old healthy male who was admitted for scrotal cellulitis with mild edema. Patient found to have incidental polycythemia.   Assessment/Plan: Polycythemia -Suspicious for myelodysplasia. No previous labs in the system. Patient denies any headache, visual symptoms, urticaria or rash, abdominal or joint pains. Chest x-rays and LFTs normal. UA unremarkable. ESR and reticulocyte normal. Ferritin normal. Check JAK 2 genotype and erythropoietin level. -Appreciate hematology recommendation . added low dose ASA to avoid risk for  Thrombosis and phlebotomy daily to reduce hb to <16 and hct<48. Received one unit phlebotomy on 10/21. Hb still elevated. We'll order for 2 units of phlebotomy followed by normal saline infusion which should reduce hct by around 6  Gm.   Scrotal  cellulitis No signs of abscess or hematoma on imaging. Pain and swelling has been improving  And Zosyn vancomycin and Zosyn to oral doxycycline. Plan treat for 2 weeks course.  Leukocytosis Reactive secondary to cellulitis  Elevated blood pressure Added when necessary hydralazine  Code Status: Full code Family Communication: none at bedside  Disposition Plan: Home once improved   Consultants:  Hematology ( Dr Alvy Bimler)  Procedures:   phlebotomy  Antibiotics:  IV vancomycin and Zosyn 10/18--  HPI/Subjective: Patient reports some pain in his scrotum. Denies any other symptoms.  Objective: Filed Vitals:   05/28/14 0434  BP: 155/103  Pulse: 92  Temp: 98.1 F (36.7 C)  Resp: 20    Intake/Output Summary (Last 24 hours) at 05/28/14 1207 Last data filed at 05/28/14 0540  Gross per 24 hour  Intake   1682 ml  Output      0 ml  Net   1682 ml   Filed Weights   05/24/14 1629  Weight: 124.921 kg (275 lb 6.4 oz)    Exam:   General: no acute  distress  HEENT: No pallor, moist oral mucosa  Chest: Clear bilaterally  CVS: Normal S1-S2, no murmurs  Abdomen: Soft, nondistended, nontender,      Data Reviewed: Basic Metabolic Panel:  Recent Labs Lab 05/24/14 1056 05/28/14 0430  NA 137 137  K 4.6 4.0  CL 100 101  CO2 26 22  GLUCOSE 176* 154*  BUN 11 9  CREATININE 0.90 0.89  CALCIUM 8.9 8.9   Liver Function Tests:  Recent Labs Lab 05/24/14 1056  AST 19  ALT 26  ALKPHOS 158*  BILITOT 1.0  PROT 6.5  ALBUMIN 2.5*   No results found for this basename: LIPASE, AMYLASE,  in the last 168 hours No results found for this basename: AMMONIA,  in the last 168 hours CBC:  Recent Labs Lab 05/24/14 1055 05/24/14 1056 05/25/14 0406 05/25/14 0940 05/27/14 05/28/14 0430  WBC 17.2* QUESTIONABLE RESULTS, RECOMMEND RECOLLECT TO VERIFY 14.3* 13.6* 11.2* 11.9*  NEUTROABS 14.8* QUESTIONABLE RESULTS, RECOMMEND RECOLLECT TO VERIFY  --   --   --   --   HGB 22.4* QUESTIONABLE RESULTS, RECOMMEND RECOLLECT TO VERIFY 23.2* 23.0* 21.2* 21.5*  HCT 67.4* QUESTIONABLE RESULTS, RECOMMEND RECOLLECT TO VERIFY 71.2* 70.6* 66.4* 69.5*  MCV 74.8* QUESTIONABLE RESULTS, RECOMMEND RECOLLECT TO VERIFY 74.6* 73.5* 74.6* 76.8*  PLT 307 QUESTIONABLE RESULTS, RECOMMEND RECOLLECT TO VERIFY 269 284 306 300   Cardiac Enzymes: No results found for this basename: CKTOTAL, CKMB, CKMBINDEX, TROPONINI,  in the last 168 hours BNP (last 3 results) No results found for this basename: PROBNP,  in the last 8760 hours CBG:  No results found for this basename: GLUCAP,  in the last 168 hours  Recent Results (from the past 240 hour(s))  CULTURE, BLOOD (ROUTINE X 2)     Status: None   Collection Time    05/24/14  4:07 PM      Result Value Ref Range Status   Specimen Description BLOOD RIGHT HAND   Final   Special Requests     Final   Value: BOTTLES DRAWN AEROBIC AND ANAEROBIC 4CC BOTH BOTTLES   Culture  Setup Time     Final   Value: 05/24/2014 18:50      Performed at [facility]   Culture     Final   Value:        BLOOD CULTURE RECEIVED NO GROWTH TO DATE CULTURE WILL BE HELD FOR 5 DAYS BEFORE ISSUING A FINAL NEGATIVE REPORT     Performed at [facility]   Report Status PENDING   Incomplete  CULTURE, BLOOD (ROUTINE X 2)     Status: None   Collection Time    05/24/14  4:07 PM      Result Value Ref Range Status   Specimen Description BLOOD RIGHT ARM   Final   Special Requests     Final   Value: BOTTLES DRAWN AEROBIC AND ANAEROBIC 4CC BOTH BOTTLES   Culture  Setup Time     Final   Value: 05/24/2014 18:50     Performed at [facility]   Culture     Final   Value:        BLOOD CULTURE RECEIVED NO GROWTH TO DATE CULTURE WILL BE HELD FOR 5 DAYS BEFORE ISSUING A FINAL NEGATIVE REPORT     Performed at [facility]   Report Status PENDING   Incomplete  URINE CULTURE     Status: None   Collection Time    05/24/14  5:59 PM      Result Value Ref Range Status   Specimen Description URINE, CLEAN CATCH   Final   Special Requests NONE   Final   Culture  Setup Time     Final   Value: 05/25/2014 00:16     Performed at [place]     Final   Value: NO GROWTH     Performed at [facility]   Culture     Final   Value: NO GROWTH     Performed at [facility]   Report Status 05/26/2014 FINAL   Final     Studies: Dg Chest 2 View  05/27/2014   CLINICAL DATA:  Cough, shortness of breath.  EXAM: CHEST  2 VIEW  COMPARISON:  None.  FINDINGS: The heart size and mediastinal contours are within normal limits. No pneumothorax or pleural effusion is noted. Right upper lobe airspace opacity is noted concerning for atelectasis or possibly pneumonia. Left lung is clear. The visualized skeletal structures are unremarkable.  IMPRESSION: Right upper lobe airspace opacity is noted concerning for pneumonia or atelectasis. Short-term follow-up radiographs are recommended to ensure resolution to  rule out the possibility of underlying neoplasm.   Electronically Signed   By: Sabino Dick M.D.   On: 05/27/2014 11:05    Scheduled Meds: . aspirin  81 mg Oral Daily  . heparin  5,000 Units Subcutaneous 3 times per day  . piperacillin-tazobactam (ZOSYN)  IV  3.375 g Intravenous Q8H  . sodium chloride  3 mL Intravenous Q12H  . vancomycin  1,500 mg Intravenous Q12H   Continuous Infusions:   Active Problems:   Cellulitis of scrotum   Polycythemia, secondary    Time spent: 25 minutes    Louellen Molder  [facility] Pager [phone] If 7PM-7AM, please contact night-coverage at [email or web address], password [facility] 05/28/2014, 12:07 PM  LOS: 4 days

## 2014-05-28 NOTE — ED Provider Notes (Signed)
Please see my initial attestation  Carmin Muskrat, MD 05/28/14 1755

## 2014-05-28 NOTE — Progress Notes (Signed)
Therapeutic phlebotomy performed per order, 1000 ml withdrawn.  Pt tolerated well, skin warm and dry. Verbalizes understanding to call for assistance before getting OOB.

## 2014-05-28 NOTE — Progress Notes (Signed)
Robert Avila   DOB:[birth year]   FM#:[id number]    Subjective: He feels well. Denies chest pain or dizziness after phlebotomy.  Objective:  Filed Vitals:   05/28/14 0434  BP: 155/103  Pulse: 92  Temp: 98.1 F (36.7 C)  Resp: 20     Intake/Output Summary (Last 24 hours) at 05/28/14 0749 Last data filed at 05/28/14 0540  Gross per 24 hour  Intake   1922 ml  Output      0 ml  Net   1922 ml    GENERAL:alert, no distress and comfortable SKIN: skin color, texture, turgor are normal, no rashes or significant lesions Musculoskeletal:no cyanosis of digits and no clubbing  NEURO: alert & oriented x 3 with fluent speech, no focal motor/sensory deficits   Labs:  Lab Results  Component Value Date   WBC 11.9* 05/28/2014   HGB 21.5* 05/28/2014   HCT 69.5* 05/28/2014   MCV 76.8* 05/28/2014   PLT 300 05/28/2014   NEUTROABS QUESTIONABLE RESULTS, RECOMMEND RECOLLECT TO VERIFY 05/24/2014    Lab Results  Component Value Date   NA 137 05/28/2014   K 4.0 05/28/2014   CL 101 05/28/2014   CO2 22 05/28/2014    Studies:  Dg Chest 2 View  05/27/2014   CLINICAL DATA:  Cough, shortness of breath.  EXAM: CHEST  2 VIEW  COMPARISON:  None.  FINDINGS: The heart size and mediastinal contours are within normal limits. No pneumothorax or pleural effusion is noted. Right upper lobe airspace opacity is noted concerning for atelectasis or possibly pneumonia. Left lung is clear. The visualized skeletal structures are unremarkable.  IMPRESSION: Right upper lobe airspace opacity is noted concerning for pneumonia or atelectasis. Short-term follow-up radiographs are recommended to ensure resolution to rule out the possibility of underlying neoplasm.   Electronically Signed   By: Sabino Dick M.D.   On: 05/27/2014 11:05    Assessment & Plan:  #1 Erythrocytosis  I suspect he may have myeloproliferative disorder. I have ordered additional work-up for this. In the meantime, I recommend phlebotomy 1-2 units of blood  daily followed by saline replacements.  Goal would be to get his hematocrit under 48. He needs to be on aspirin to prevent risk of thrombosis.   #2 Leukocytosis  This is likely reactive in the setting of scrotal cellulitis and dehydration, possible related to myeloproliferative disorder Values are now trending down.  #3 Scrotal Cellulitis  On vancomycin and zosyn per primary team   #4 Discharge planning Next 2 to 3 days if hemoglobin improves to less than 16 g or HCT <48. The risk of discharging him now in the setting of very high hemoglobin would be stroke, MI or death.  Will follow. [facility], Robert Guess, MD 05/28/2014     Robert Lark, MD 05/28/2014  7:49 AM

## 2014-05-29 LAB — CBC WITH DIFFERENTIAL/PLATELET
BASOS PCT: 1 % (ref 0–1)
Basophils Absolute: 0.1 10*3/uL (ref 0.0–0.1)
EOS PCT: 5 % (ref 0–5)
Eosinophils Absolute: 0.6 10*3/uL (ref 0.0–0.7)
HEMATOCRIT: 70 % — AB (ref 39.0–52.0)
Hemoglobin: 22 g/dL (ref 13.0–17.0)
LYMPHS ABS: 1.3 10*3/uL (ref 0.7–4.0)
Lymphocytes Relative: 10 % — ABNORMAL LOW (ref 12–46)
MCH: 24 pg — AB (ref 26.0–34.0)
MCHC: 31.4 g/dL (ref 30.0–36.0)
MCV: 76.3 fL — AB (ref 78.0–100.0)
MONOS PCT: 4 % (ref 3–12)
Monocytes Absolute: 0.5 10*3/uL (ref 0.1–1.0)
NEUTROS ABS: 10 10*3/uL — AB (ref 1.7–7.7)
Neutrophils Relative %: 80 % — ABNORMAL HIGH (ref 43–77)
PLATELETS: 350 10*3/uL (ref 150–400)
RBC: 9.18 MIL/uL — AB (ref 4.22–5.81)
RDW: 18.8 % — ABNORMAL HIGH (ref 11.5–15.5)
WBC: 12.5 10*3/uL — ABNORMAL HIGH (ref 4.0–10.5)

## 2014-05-29 MED ORDER — SODIUM CHLORIDE 0.9 % IV BOLUS (SEPSIS)
1000.0000 mL | Freq: Once | INTRAVENOUS | Status: AC
  Administered 2014-05-29: 1000 mL via INTRAVENOUS

## 2014-05-29 MED ORDER — SODIUM CHLORIDE 0.9 % IV BOLUS (SEPSIS): 1000.0000 mL | Freq: Once | INTRAVENOUS | Status: DC

## 2014-05-29 NOTE — Progress Notes (Addendum)
[facility] PROGRESS NOTE  Lieutenant Abarca SAY:[id number] DOB: 11-Feb-1981 DOA: 05/24/2014 PCP: No primary provider on file.  Brief narrative 33 year old healthy male who was admitted for scrotal cellulitis with mild edema. Patient found to have incidental polycythemia.   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 Patient denies any headache, visual symptoms, urticaria or rash, abdominal or joint pains. Chest x-rays and LFTs normal. UA unremarkable.low MCV.  ESR and reticulocyte normal. Ferritin normal.  JAK 2 genotypeand erythropoietin level sent -hb still high at 22 and hct of 70 today.  -Appreciate hematology recommendation . added low dose ASA to avoid risk for  Thrombosis and phlebotomy daily to reduce hb to <16 and hct<48. 9 currently removing 1L of blood followed by NS infusion, 500 cc before and after phlebotomy) Received one unit phlebotomy on 10/21, 2 units on 1022 and 10/23. Hb still elevated.    Scrotal  cellulitis No signs of abscess or hematoma on imaging. Pain and swelling has been improving  Empiric  vancomycin and Zosyn on admission switched to  to oral doxycycline. Plan treat for 2 weeks course.  Leukocytosis Reactive secondary to cellulitis vs myeloproliferative disorder  Elevated blood pressure Added when necessary hydralazine  Code Status: Full code Family Communication: none at bedside   Disposition Plan: Home once hb <16 and hct<48. wil d/w Dr Alvy Bimler again on Monday to see if daily outpt follow up for phlebotomy in the office may be an option   Consultants:  Hematology ( Dr Alvy Bimler)  Procedures:   phlebotomy  Antibiotics:  IV vancomycin and Zosyn 10/18--10/22  Oral doxycycline 10/22 until 10/31  HPI/Subjective: Patient reports some pain in his scrotum. Denies any other symptoms.  Objective: Filed Vitals:   05/28/14 2245  BP: 154/90  Pulse: 93  Temp: 98.5 F (36.9 C)   Resp: 20    Intake/Output Summary (Last 24 hours) at 05/29/14 0954 Last data filed at 05/28/14 1900  Gross per 24 hour  Intake    720 ml  Output   1000 ml  Net   -280 ml   Filed Weights   05/24/14 1629  Weight: 124.921 kg (275 lb 6.4 oz)    Exam:   General: no acute distress  HEENT: No pallor, moist oral mucosa  Chest: Clear bilaterally  CVS: Normal S1-S2, no murmurs  Abdomen: Soft, nondistended, nontender, reduced scrotal swelling with mild erythema, non tender    Data Reviewed: Basic Metabolic Panel:  Recent Labs Lab 05/24/14 1056 05/28/14 0430  NA 137 137  K 4.6 4.0  CL 100 101  CO2 26 22  GLUCOSE 176* 154*  BUN 11 9  CREATININE 0.90 0.89  CALCIUM 8.9 8.9   Liver Function Tests:  Recent Labs Lab 05/24/14 1056  AST 19  ALT 26  ALKPHOS 158*  BILITOT 1.0  PROT 6.5  ALBUMIN 2.5*   No results found for this basename: LIPASE, AMYLASE,  in the last 168 hours No results found for this basename: AMMONIA,  in the last 168 hours CBC:  Recent Labs Lab 05/24/14 1055 05/24/14 1056 05/25/14 0406 05/25/14 0940 05/27/14 05/28/14 0430 05/29/14 0529  WBC 17.2* QUESTIONABLE RESULTS, RECOMMEND RECOLLECT TO VERIFY 14.3* 13.6* 11.2* 11.9* 12.5*  NEUTROABS 14.8* QUESTIONABLE RESULTS, RECOMMEND RECOLLECT TO VERIFY  --   --   --   --  10.0*  HGB 22.4* QUESTIONABLE RESULTS, RECOMMEND RECOLLECT TO VERIFY 23.2* 23.0* 21.2* 21.5* 22.0*  HCT 67.4* QUESTIONABLE RESULTS, RECOMMEND RECOLLECT TO VERIFY 71.2*  70.6* 66.4* 69.5* 70.0*  MCV 74.8* QUESTIONABLE RESULTS, RECOMMEND RECOLLECT TO VERIFY 74.6* 73.5* 74.6* 76.8* 76.3*  PLT 307 QUESTIONABLE RESULTS, RECOMMEND RECOLLECT TO VERIFY 269 284 306 300 350   Cardiac Enzymes: No results found for this basename: CKTOTAL, CKMB, CKMBINDEX, TROPONINI,  in the last 168 hours BNP (last 3 results) No results found for this basename: PROBNP,  in the last 8760 hours CBG: No results found for this basename: GLUCAP,  in the last  168 hours  Recent Results (from the past 240 hour(s))  CULTURE, BLOOD (ROUTINE X 2)     Status: None   Collection Time    05/24/14  4:07 PM      Result Value Ref Range Status   Specimen Description BLOOD RIGHT HAND   Final   Special Requests     Final   Value: BOTTLES DRAWN AEROBIC AND ANAEROBIC 4CC BOTH BOTTLES   Culture  Setup Time     Final   Value: 05/24/2014 18:50     Performed at [facility]   Culture     Final   Value:        BLOOD CULTURE RECEIVED NO GROWTH TO DATE CULTURE WILL BE HELD FOR 5 DAYS BEFORE ISSUING A FINAL NEGATIVE REPORT     Performed at [facility]   Report Status PENDING   Incomplete  CULTURE, BLOOD (ROUTINE X 2)     Status: None   Collection Time    05/24/14  4:07 PM      Result Value Ref Range Status   Specimen Description BLOOD RIGHT ARM   Final   Special Requests     Final   Value: BOTTLES DRAWN AEROBIC AND ANAEROBIC 4CC BOTH BOTTLES   Culture  Setup Time     Final   Value: 05/24/2014 18:50     Performed at [facility]   Culture     Final   Value:        BLOOD CULTURE RECEIVED NO GROWTH TO DATE CULTURE WILL BE HELD FOR 5 DAYS BEFORE ISSUING A FINAL NEGATIVE REPORT     Performed at [facility]   Report Status PENDING   Incomplete  URINE CULTURE     Status: None   Collection Time    05/24/14  5:59 PM      Result Value Ref Range Status   Specimen Description URINE, CLEAN CATCH   Final   Special Requests NONE   Final   Culture  Setup Time     Final   Value: 05/25/2014 00:16     Performed at [facility]     Final   Value: NO GROWTH     Performed at [facility]   Culture     Final   Value: NO GROWTH     Performed at [facility]   Report Status 05/26/2014 FINAL   Final     Studies: Dg Chest 2 View  05/27/2014   CLINICAL DATA:  Cough, shortness of breath.  EXAM: CHEST  2 VIEW  COMPARISON:  None.  FINDINGS: The heart size and mediastinal contours are within normal  limits. No pneumothorax or pleural effusion is noted. Right upper lobe airspace opacity is noted concerning for atelectasis or possibly pneumonia. Left lung is clear. The visualized skeletal structures are unremarkable.  IMPRESSION: Right upper lobe airspace opacity is noted concerning for pneumonia or atelectasis. Short-term follow-up radiographs are recommended to ensure resolution  to rule out the possibility of underlying neoplasm.   Electronically Signed   By: Sabino Dick M.D.   On: 05/27/2014 11:05    Scheduled Meds: . aspirin  81 mg Oral Daily  . doxycycline  100 mg Oral Q12H  . heparin  5,000 Units Subcutaneous 3 times per day  . sodium chloride  1,000 mL Intravenous Once  . sodium chloride  1,000 mL Intravenous Once  . sodium chloride  3 mL Intravenous Q12H   Continuous Infusions:   Active Problems:   Cellulitis of scrotum   Polycythemia, secondary    Time spent: 25 minutes    Louellen Molder  [facility] Pager [phone] If 7PM-7AM, please contact night-coverage at [email or web address], password [facility] 05/29/2014, 9:54 AM  LOS: 5 days

## 2014-05-29 NOTE — Progress Notes (Signed)
Robert Avila   DOB:04-07-1981   ZS#:[id number]    Subjective:He feels well. Denies chest pain or dizziness after phlebotomy.   Objective:  Filed Vitals:   05/28/14 2245  BP: 154/90  Pulse: 93  Temp: 98.5 F (36.9 C)  Resp: 20     Intake/Output Summary (Last 24 hours) at 05/29/14 0830 Last data filed at 05/28/14 1900  Gross per 24 hour  Intake   1080 ml  Output   1000 ml  Net     80 ml    GENERAL:alert, no distress and comfortable SKIN: skin color, texture, turgor are normal, no rashes or significant lesions EYES: normal, Conjunctiva are pink and non-injected, sclera clear OROPHARYNX:no exudate, no erythema and lips, buccal mucosa, and tongue normal  NECK: supple, thyroid normal size, non-tender, without nodularity LYMPH:  no palpable lymphadenopathy in the cervical, axillary or inguinal LUNGS: clear to auscultation and percussion with normal breathing effort HEART: regular rate & rhythm and no murmurs and no lower extremity edema ABDOMEN:abdomen soft, non-tender and normal bowel sounds Musculoskeletal:no cyanosis of digits and no clubbing  NEURO: alert & oriented x 3 with fluent speech, no focal motor/sensory deficits   Labs:  Lab Results  Component Value Date   WBC 12.5* 05/29/2014   HGB 22.0* 05/29/2014   HCT 70.0* 05/29/2014   MCV 76.3* 05/29/2014   PLT 350 05/29/2014   NEUTROABS 10.0* 05/29/2014    Lab Results  Component Value Date   NA 137 05/28/2014   K 4.0 05/28/2014   CL 101 05/28/2014   CO2 22 05/28/2014    Studies:  Dg Chest 2 View  05/27/2014   CLINICAL DATA:  Cough, shortness of breath.  EXAM: CHEST  2 VIEW  COMPARISON:  None.  FINDINGS: The heart size and mediastinal contours are within normal limits. No pneumothorax or pleural effusion is noted. Right upper lobe airspace opacity is noted concerning for atelectasis or possibly pneumonia. Left lung is clear. The visualized skeletal structures are unremarkable.  IMPRESSION: Right upper lobe airspace  opacity is noted concerning for pneumonia or atelectasis. Short-term follow-up radiographs are recommended to ensure resolution to rule out the possibility of underlying neoplasm.   Electronically Signed   By: Sabino Dick M.D.   On: 05/27/2014 11:05    Assessment & Plan:  #1 Erythrocytosis  I suspect he may have myeloproliferative disorder. I have ordered additional work-up for this. In the meantime, I recommend phlebotomy 1-2 units of blood daily with saline replacements.  Goal would be to get his hematocrit under 48. He needs to be on aspirin to prevent risk of thrombosis.   #2 Leukocytosis  This is likely reactive in the setting of scrotal cellulitis and dehydration, possible related to myeloproliferative disorder Values are now trending down.  #3 Scrotal Cellulitis  On antibiotics per primary team   #4 Discharge planning Next 2 to 3 days if hemoglobin improves to less than 16 g or HCT <48. The risk of discharging him now in the setting of very high hemoglobin would be stroke, MI or death.  Will follow.   [place], [place], MD 05/29/2014  8:30 AM

## 2014-05-29 NOTE — Progress Notes (Signed)
Therapeutic phlebotomy performed per order, 1000 ml withdrawn. Pt tolerated well, skin warm and dry. Verbalizes understanding to call for assistance before getting OOB.

## 2014-05-30 LAB — CBC WITH DIFFERENTIAL/PLATELET
Basophils Absolute: 0.1 10*3/uL (ref 0.0–0.1)
Basophils Relative: 1 % (ref 0–1)
EOS ABS: 0.6 10*3/uL (ref 0.0–0.7)
EOS PCT: 5 % (ref 0–5)
HEMATOCRIT: 61.1 % — AB (ref 39.0–52.0)
Hemoglobin: 19.9 g/dL — ABNORMAL HIGH (ref 13.0–17.0)
LYMPHS PCT: 9 % — AB (ref 12–46)
Lymphs Abs: 1.2 10*3/uL (ref 0.7–4.0)
MCH: 24.4 pg — AB (ref 26.0–34.0)
MCHC: 32.6 g/dL (ref 30.0–36.0)
MCV: 75.1 fL — ABNORMAL LOW (ref 78.0–100.0)
Monocytes Absolute: 0.6 10*3/uL (ref 0.1–1.0)
Monocytes Relative: 4 % (ref 3–12)
Neutro Abs: 10.8 10*3/uL — ABNORMAL HIGH (ref 1.7–7.7)
Neutrophils Relative %: 81 % — ABNORMAL HIGH (ref 43–77)
Platelets: 398 10*3/uL (ref 150–400)
RBC: 8.14 MIL/uL — ABNORMAL HIGH (ref 4.22–5.81)
RDW: 19.8 % — ABNORMAL HIGH (ref 11.5–15.5)
WBC: 13.3 10*3/uL — AB (ref 4.0–10.5)

## 2014-05-30 LAB — CULTURE, BLOOD (ROUTINE X 2)
CULTURE: NO GROWTH
Culture: NO GROWTH

## 2014-05-30 NOTE — Progress Notes (Signed)
Md. notified of no phlebotomy orders are in for today.

## 2014-05-30 NOTE — Progress Notes (Signed)
Patient ID: Robert Avila, male   DOB: 29-Jan-1981, 33 y.o.   MRN: [id number]  [facility] PROGRESS NOTE  Okley Magnussen JHE:[id number] DOB: January 11, 1981 DOA: 05/24/2014 PCP: No primary provider on file.  Brief narrative: 33 year old healthy male who was admitted for scrotal cellulitis with mild edema. Patient found to have incidental polycythemia. He has denies any headaches, visual symptoms, urticaria or rash, abdominal or joint pains.   Assessment/Plan:    Active Problems: Polycythemia vera  - ? myeloproliferative disorder  - CXR and LFT's stable - Hg still high, trend over the past 48 hours: 22 --> 19.9 this AM - low dose ASA added to lower risk of thrombosis  - continue with daily phlebotomy until Hg < 16 - Received one unit phlebotomy on 10/21, 2 units on 1022 and 10/23 Scrotal cellulitis  - with no signs of abscess on imaging  - empiric Vancomycin and Zosyn provided on admission and changed to doxycycline - continue doxycycline for 2 weeks Leukocytosis  - Reactive secondary to cellulitis vs myeloproliferative disorder  - repeat CBC in AM Elevated blood pressure  - Added when necessary hydralazine   DVT Prophylaxis: Heparin SQ   Code Status: Full.  Family Communication:  plan of care discussed with the patient Disposition Plan: Home when stable.   IV Access:   Peripheral IV Procedures and diagnostic studies:    Phlebotomy  Medical Consultants:   Oncology  Other Consultants:   None Anti-Infectives:    IV vancomycin and Zosyn 10/18--10/22  Oral doxycycline 10/22 until 10/31  Leisa Lenz, MD  [facility] Pager [phone]  If 7PM-7AM, please contact night-coverage [email or web address] Password [email or web address] 05/30/2014, 9:51 AM   LOS: 6 days    HPI/Subjective: No acute overnight events.  Objective: Filed Vitals:   05/28/14 1400 05/28/14 2245 05/29/14 1436 05/29/14 2125  BP: 145/90 154/90 148/95 149/92  Pulse: 89 93 82 97  Temp: 98.2 F (36.8 C) 98.5 F (36.9  C) 98.8 F (37.1 C) 98.2 F (36.8 C)  TempSrc: Oral Oral Oral Oral  Resp: 20 20 20 20   Height:      Weight:      SpO2: 95% 96% 95% 94%    Intake/Output Summary (Last 24 hours) at 05/30/14 0951 Last data filed at 05/29/14 2126  Gross per 24 hour  Intake    480 ml  Output   1000 ml  Net   -520 ml    Exam:   General:  Pt is alert, follows commands appropriately, not in acute distress  Cardiovascular: Regular rate and rhythm, S1/S2, no murmurs  Respiratory: Clear to auscultation bilaterally, no wheezing, no crackles, no rhonchi  Abdomen: Soft, non tender, non distended, bowel sounds present  Extremities: No edema, pulses DP and PT palpable bilaterally  Neuro: Grossly nonfocal  Data Reviewed: Basic Metabolic Panel:  Recent Labs Lab 05/24/14 1056 05/28/14 0430  NA 137 137  K 4.6 4.0  CL 100 101  CO2 26 22  GLUCOSE 176* 154*  BUN 11 9  CREATININE 0.90 0.89  CALCIUM 8.9 8.9   Liver Function Tests:  Recent Labs Lab 05/24/14 1056  AST 19  ALT 26  ALKPHOS 158*  BILITOT 1.0  PROT 6.5  ALBUMIN 2.5*   CBC:  Recent Labs Lab 05/24/14 1055 05/24/14 1056  05/25/14 0940 05/27/14 05/28/14 0430 05/29/14 0529 05/30/14 0548  WBC 17.2* QUESTIONABLE RESULTS, RECOMMEND RECOLLECT TO VERIFY  < > 13.6* 11.2* 11.9* 12.5* 13.3*  NEUTROABS 14.8* QUESTIONABLE RESULTS, RECOMMEND RECOLLECT TO VERIFY  --   --   --   --  10.0* 10.8*  HGB 22.4* QUESTIONABLE RESULTS, RECOMMEND RECOLLECT TO VERIFY  < > 23.0* 21.2* 21.5* 22.0* 19.9*  HCT 67.4* QUESTIONABLE RESULTS, RECOMMEND RECOLLECT TO VERIFY  < > 70.6* 66.4* 69.5* 70.0* 61.1*  MCV 74.8* QUESTIONABLE RESULTS, RECOMMEND RECOLLECT TO VERIFY  < > 73.5* 74.6* 76.8* 76.3* 75.1*  PLT 307 QUESTIONABLE RESULTS, RECOMMEND RECOLLECT TO VERIFY  < > 284 306 300 350 398  < > = values in this interval not displayed.  Recent Results (from the past 240 hour(s))  CULTURE, BLOOD (ROUTINE X 2)     Status: None   Collection Time    05/24/14   4:07 PM      Result Value Ref Range Status   Specimen Description BLOOD RIGHT HAND   Final   Special Requests     Final   Value: BOTTLES DRAWN AEROBIC AND ANAEROBIC 4CC BOTH BOTTLES   Culture  Setup Time     Final   Value: 05/24/2014 18:50     Performed at [facility]   Culture     Final   Value:        BLOOD CULTURE RECEIVED NO GROWTH TO DATE CULTURE WILL BE HELD FOR 5 DAYS BEFORE ISSUING A FINAL NEGATIVE REPORT     Performed at [facility]   Report Status PENDING   Incomplete  CULTURE, BLOOD (ROUTINE X 2)     Status: None   Collection Time    05/24/14  4:07 PM      Result Value Ref Range Status   Specimen Description BLOOD RIGHT ARM   Final   Special Requests     Final   Value: BOTTLES DRAWN AEROBIC AND ANAEROBIC 4CC BOTH BOTTLES   Culture  Setup Time     Final   Value: 05/24/2014 18:50     Performed at [facility]   Culture     Final   Value:        BLOOD CULTURE RECEIVED NO GROWTH TO DATE CULTURE WILL BE HELD FOR 5 DAYS BEFORE ISSUING A FINAL NEGATIVE REPORT     Performed at [facility]   Report Status PENDING   Incomplete  URINE CULTURE     Status: None   Collection Time    05/24/14  5:59 PM      Result Value Ref Range Status   Specimen Description URINE, CLEAN CATCH   Final   Special Requests NONE   Final   Culture  Setup Time     Final   Value: 05/25/2014 00:16     Performed at [place]     Final   Value: NO GROWTH     Performed at [facility]   Culture     Final   Value: NO GROWTH     Performed at [facility]   Report Status 05/26/2014 FINAL   Final     Scheduled Meds: . aspirin  81 mg Oral Daily  . doxycycline  100 mg Oral Q12H  . heparin  5,000 Units Subcutaneous 3 times per day  . sodium chloride  1,000 mL Intravenous Once  . sodium chloride  3 mL Intravenous Q12H   Continuous Infusions:

## 2014-05-31 DIAGNOSIS — L729 Follicular cyst of the skin and subcutaneous tissue, unspecified: Secondary | ICD-10-CM

## 2014-05-31 LAB — CBC WITH DIFFERENTIAL/PLATELET
BASOS ABS: 0.2 10*3/uL — AB (ref 0.0–0.1)
BASOS PCT: 1 % (ref 0–1)
EOS PCT: 4 % (ref 0–5)
Eosinophils Absolute: 0.5 10*3/uL (ref 0.0–0.7)
HEMATOCRIT: 60.1 % — AB (ref 39.0–52.0)
Hemoglobin: 19.9 g/dL — ABNORMAL HIGH (ref 13.0–17.0)
Lymphocytes Relative: 11 % — ABNORMAL LOW (ref 12–46)
Lymphs Abs: 1.4 10*3/uL (ref 0.7–4.0)
MCH: 24.7 pg — ABNORMAL LOW (ref 26.0–34.0)
MCHC: 33.1 g/dL (ref 30.0–36.0)
MCV: 74.5 fL — AB (ref 78.0–100.0)
MONO ABS: 0.8 10*3/uL (ref 0.1–1.0)
Monocytes Relative: 6 % (ref 3–12)
Neutro Abs: 10.6 10*3/uL — ABNORMAL HIGH (ref 1.7–7.7)
Neutrophils Relative %: 78 % — ABNORMAL HIGH (ref 43–77)
Platelets: 353 10*3/uL (ref 150–400)
RBC: 8.07 MIL/uL — ABNORMAL HIGH (ref 4.22–5.81)
RDW: 19.4 % — AB (ref 11.5–15.5)
WBC: 13.5 10*3/uL — ABNORMAL HIGH (ref 4.0–10.5)

## 2014-05-31 MED ORDER — IBUPROFEN 200 MG PO TABS
200.0000 mg | ORAL_TABLET | Freq: Four times a day (QID) | ORAL | Status: DC | PRN
  Administered 2014-05-31: 200 mg via ORAL
  Filled 2014-05-31 (×2): qty 1

## 2014-05-31 NOTE — Progress Notes (Addendum)
Patient ID: Robert Avila, male   DOB: Nov 23, 1980, 33 y.o.   MRN: [id number] [facility] PROGRESS NOTE  [name] RCV:[id number] DOB: May 25, 1981 DOA: 05/24/2014 PCP: No primary provider on file.  Brief narrative: 33 year old healthy male who was admitted for scrotal cellulitis with mild edema. Patient found to have incidental polycythemia. He has denies any headaches, visual symptoms, urticaria or rash, abdominal or joint pains.   Assessment/Plan:   Active Problems:  Polycythemia vera  - ? myeloproliferative disorder  - CXR and LFT's stable  - Hemoglobin remains 19.9. Patient did not get phlebotomy on 05/30/2014. I spoke with hematology on call to get input on continuing phlebotomy. We'll place order for phlebotomy today. Dr. Calton Dach recommendation was to have hematocrit less than 48. - low dose ASA added to lower risk of thrombosis  - Received one unit phlebotomy on 10/21, 2 units on 1022 and 10/23 Scrotal cellulitis  - with no signs of abscess on imaging  - empiric Vancomycin and Zosyn provided on admission and changed to doxycycline  - continue doxycycline for total of 2 weeks  Leukocytosis  - Reactive secondary to cellulitis vs myeloproliferative disorder  - repeat CBC in AM  Cyst on the back - I asked for surgery consult and their recommendation if it needs to be drained.  Elevated blood pressure  - Added when necessary hydralazine    DVT Prophylaxis: Heparin SQ    Code Status: Full.  Family Communication: plan of care discussed with the patient  Disposition Plan: Home when stable.    IV Access:   Peripheral IV Procedures and diagnostic studies:    Phlebotomy   Medical Consultants:   Oncology (Dr. Heath Lark) Surgery (Dr. Autumn Messing)  Other Consultants:   None  Anti-Infectives:   IV vancomycin and Zosyn 10/18--10/22  Oral doxycycline 10/22 until 10/31   Leisa Lenz, MD  [facility] Pager [phone]  If 7PM-7AM, please contact  night-coverage [email or web address] Password [email or web address] 05/31/2014, 10:07 AM   LOS: 7 days    HPI/Subjective: No acute overnight events.  Objective: Filed Vitals:   05/29/14 2125 05/30/14 1406 05/30/14 2129 05/31/14 0457  BP: 149/92 150/90 143/89 138/83  Pulse: 97 94 104 88  Temp: 98.2 F (36.8 C) 98.4 F (36.9 C) 98.2 F (36.8 C) 98.2 F (36.8 C)  TempSrc: Oral Oral Oral Oral  Resp: 20 20 20 18   Height:      Weight:      SpO2: 94% 95% 95% 94%    Intake/Output Summary (Last 24 hours) at 05/31/14 1007 Last data filed at 05/31/14 0457  Gross per 24 hour  Intake   1340 ml  Output      0 ml  Net   1340 ml    Exam:   General:  Pt is alert, follows commands appropriately, not in acute distress  Cardiovascular: Regular rate and rhythm, S1/S2, no murmurs  Respiratory: Clear to auscultation bilaterally, no wheezing; cyst on the back, no drainage   Abdomen: Soft, non tender, non distended, bowel sounds present  Extremities: No edema, pulses DP and PT palpable bilaterally  Neuro: Grossly nonfocal  Data Reviewed: Basic Metabolic Panel:  Recent Labs Lab 05/24/14 1056 05/28/14 0430  NA 137 137  K 4.6 4.0  CL 100 101  CO2 26 22  GLUCOSE 176* 154*  BUN 11 9  CREATININE 0.90 0.89  CALCIUM 8.9 8.9   Liver Function Tests:  Recent Labs Lab 05/24/14 1056  AST 19  ALT 26  ALKPHOS 158*  BILITOT 1.0  PROT 6.5  ALBUMIN 2.5*   No results found for this basename: LIPASE, AMYLASE,  in the last 168 hours No results found for this basename: AMMONIA,  in the last 168 hours CBC:  Recent Labs Lab 05/24/14 1055 05/24/14 1056  05/27/14 05/28/14 0430 05/29/14 0529 05/30/14 0548 05/31/14 0454  WBC 17.2* QUESTIONABLE RESULTS, RECOMMEND RECOLLECT TO VERIFY  < > 11.2* 11.9* 12.5* 13.3* 13.5*  NEUTROABS 14.8* QUESTIONABLE RESULTS, RECOMMEND RECOLLECT TO VERIFY  --   --   --  10.0* 10.8* 10.6*  HGB 22.4* QUESTIONABLE RESULTS, RECOMMEND RECOLLECT TO VERIFY  < > 21.2* 21.5* 22.0*  19.9* 19.9*  HCT 67.4* QUESTIONABLE RESULTS, RECOMMEND RECOLLECT TO VERIFY  < > 66.4* 69.5* 70.0* 61.1* 60.1*  MCV 74.8* QUESTIONABLE RESULTS, RECOMMEND RECOLLECT TO VERIFY  < > 74.6* 76.8* 76.3* 75.1* 74.5*  PLT 307 QUESTIONABLE RESULTS, RECOMMEND RECOLLECT TO VERIFY  < > 306 300 350 398 353  < > = values in this interval not displayed. Cardiac Enzymes: No results found for this basename: CKTOTAL, CKMB, CKMBINDEX, TROPONINI,  in the last 168 hours BNP: No components found with this basename: POCBNP,  CBG: No results found for this basename: GLUCAP,  in the last 168 hours  Recent Results (from the past 240 hour(s))  CULTURE, BLOOD (ROUTINE X 2)     Status: None   Collection Time    05/24/14  4:07 PM      Result Value Ref Range Status   Specimen Description BLOOD RIGHT HAND   Final   Special Requests     Final   Value: BOTTLES DRAWN AEROBIC AND ANAEROBIC 4CC BOTH BOTTLES   Culture  Setup Time     Final   Value: 05/24/2014 18:50     Performed at [facility]   Culture     Final   Value: NO GROWTH 5 DAYS     Performed at [facility]   Report Status 05/30/2014 FINAL   Final  CULTURE, BLOOD (ROUTINE X 2)     Status: None   Collection Time    05/24/14  4:07 PM      Result Value Ref Range Status   Specimen Description BLOOD RIGHT ARM   Final   Special Requests     Final   Value: BOTTLES DRAWN AEROBIC AND ANAEROBIC 4CC BOTH BOTTLES   Culture  Setup Time     Final   Value: 05/24/2014 18:50     Performed at [facility]   Culture     Final   Value: NO GROWTH 5 DAYS     Performed at [facility]   Report Status 05/30/2014 FINAL   Final  URINE CULTURE     Status: None   Collection Time    05/24/14  5:59 PM      Result Value Ref Range Status   Specimen Description URINE, CLEAN CATCH   Final   Special Requests NONE   Final   Culture  Setup Time     Final   Value: 05/25/2014 00:16     Performed at [facility]     Final    Value: NO GROWTH     Performed at [facility]   Culture     Final   Value: NO GROWTH     Performed at [facility]   Report Status 05/26/2014 FINAL   Final     Scheduled Meds: . aspirin  81 mg Oral Daily  . doxycycline  100 mg Oral Q12H  . heparin  5,000 Units Subcutaneous 3 times per day  . sodium chloride  1,000 mL Intravenous Once  . sodium chloride  3 mL Intravenous Q12H   Continuous Infusions:

## 2014-05-31 NOTE — Plan of Care (Signed)
Problem: Phase III Progression Outcomes Goal: Activity at appropriate level-compared to baseline (UP IN CHAIR FOR HEMODIALYSIS)  Outcome: Progressing Ambulates to the bathroom

## 2014-05-31 NOTE — Consult Note (Signed)
Reason for Consult:cyst on back Referring Physician: Dr. Bernell List Robert Avila is an 33 y.o. male.  HPI: The pt is a 33yo wm who is admitted with polycythemia vera. He was noted to have a cyst on his back. He says this has been present for years. He has had it drained years ago. He denies any fever or pain  Past Medical History  Diagnosis Date  . Hypertension     Past Surgical History  Procedure Laterality Date  . Dental surgery      Family History  Problem Relation Age of Onset  . Diabetes Father     Social History:  reports that he has been smoking.  He does not have any smokeless tobacco history on file. He reports that he drinks alcohol. His drug history is not on file.  Allergies: No Known Allergies  Medications: I have reviewed the patient's current medications.  Results for orders placed during the hospital encounter of 05/24/14 (from the past 48 hour(s))  CBC WITH DIFFERENTIAL     Status: Abnormal   Collection Time    05/30/14  5:48 AM      Result Value Ref Range   WBC 13.3 (*) 4.0 - 10.5 K/uL   RBC 8.14 (*) 4.22 - 5.81 MIL/uL   Hemoglobin 19.9 (*) 13.0 - 17.0 g/dL   HCT 61.1 (*) 39.0 - 52.0 %   MCV 75.1 (*) 78.0 - 100.0 fL   MCH 24.4 (*) 26.0 - 34.0 pg   MCHC 32.6  30.0 - 36.0 g/dL   RDW 19.8 (*) 11.5 - 15.5 %   Platelets 398  150 - 400 K/uL   Neutrophils Relative % 81 (*) 43 - 77 %   Neutro Abs 10.8 (*) 1.7 - 7.7 K/uL   Lymphocytes Relative 9 (*) 12 - 46 %   Lymphs Abs 1.2  0.7 - 4.0 K/uL   Monocytes Relative 4  3 - 12 %   Monocytes Absolute 0.6  0.1 - 1.0 K/uL   Eosinophils Relative 5  0 - 5 %   Eosinophils Absolute 0.6  0.0 - 0.7 K/uL   Basophils Relative 1  0 - 1 %   Basophils Absolute 0.1  0.0 - 0.1 K/uL  CBC WITH DIFFERENTIAL     Status: Abnormal   Collection Time    05/31/14  4:54 AM      Result Value Ref Range   WBC 13.5 (*) 4.0 - 10.5 K/uL   RBC 8.07 (*) 4.22 - 5.81 MIL/uL   Hemoglobin 19.9 (*) 13.0 - 17.0 g/dL   HCT 60.1 (*) 39.0 - 52.0 %   MCV 74.5 (*) 78.0 - 100.0 fL   MCH 24.7 (*) 26.0 - 34.0 pg   MCHC 33.1  30.0 - 36.0 g/dL   RDW 19.4 (*) 11.5 - 15.5 %   Platelets 353  150 - 400 K/uL   Neutrophils Relative % 78 (*) 43 - 77 %   Neutro Abs 10.6 (*) 1.7 - 7.7 K/uL   Lymphocytes Relative 11 (*) 12 - 46 %   Lymphs Abs 1.4  0.7 - 4.0 K/uL   Monocytes Relative 6  3 - 12 %   Monocytes Absolute 0.8  0.1 - 1.0 K/uL   Eosinophils Relative 4  0 - 5 %   Eosinophils Absolute 0.5  0.0 - 0.7 K/uL   Basophils Relative 1  0 - 1 %   Basophils Absolute 0.2 (*) 0.0 - 0.1 K/uL    No results  found.  Review of Systems  Constitutional: Negative.   HENT: Negative.   Eyes: Negative.   Respiratory: Negative.   Cardiovascular: Negative.   Gastrointestinal: Negative.   Genitourinary: Negative.   Musculoskeletal: Negative.   Skin: Negative.   Neurological: Negative.   Endo/Heme/Allergies: Negative.   Psychiatric/Behavioral: Negative.    Blood pressure 138/83, pulse 88, temperature 98.2 F (36.8 C), temperature source Oral, resp. rate 18, height 6\' 1"  (1.854 m), weight 275 lb 6.4 oz (124.921 kg), SpO2 94.00%. Physical Exam  Constitutional: He is oriented to person, place, and time. He appears well-developed and well-nourished.  HENT:  Head: Normocephalic and atraumatic.  Eyes: Conjunctivae and EOM are normal. Pupils are equal, round, and reactive to light.  Neck: Normal range of motion. Neck supple.  Respiratory: Effort normal and breath sounds normal.  2-3 cm sebaceous cyst on back. No cellulitis. No tenderness  GI: Soft. Bowel sounds are normal.  Musculoskeletal: Normal range of motion.  Neurological: He is alert and oriented to person, place, and time.  Skin: Skin is warm and dry.  Psychiatric: He has a normal mood and affect. His behavior is normal.    Assessment/Plan: The pt has a sebaceous cyst on his back. It does not appear to be infected. It can be removed as an outpt if he would like to have this done. He can follow up  with [place] at our office once his medical condition has been corrected  TOTH III,Sailor Haughn S 05/31/2014, 10:21 AM

## 2014-05-31 NOTE — Progress Notes (Signed)
Therapeutic phlebotomy performed, 1000 ml withdrawn from RAC PIV.  Pt tolerated without ADN.

## 2014-06-01 ENCOUNTER — Telehealth: Payer: Self-pay | Admitting: *Deleted

## 2014-06-01 ENCOUNTER — Inpatient Hospital Stay (HOSPITAL_COMMUNITY): Payer: MEDICAID

## 2014-06-01 DIAGNOSIS — D72829 Elevated white blood cell count, unspecified: Secondary | ICD-10-CM

## 2014-06-01 DIAGNOSIS — D471 Chronic myeloproliferative disease: Secondary | ICD-10-CM

## 2014-06-01 DIAGNOSIS — M109 Gout, unspecified: Secondary | ICD-10-CM | POA: Diagnosis present

## 2014-06-01 DIAGNOSIS — L723 Sebaceous cyst: Secondary | ICD-10-CM | POA: Diagnosis present

## 2014-06-01 LAB — CBC
HCT: 58.6 % — ABNORMAL HIGH (ref 39.0–52.0)
Hemoglobin: 19.3 g/dL — ABNORMAL HIGH (ref 13.0–17.0)
MCH: 24.6 pg — ABNORMAL LOW (ref 26.0–34.0)
MCHC: 32.9 g/dL (ref 30.0–36.0)
MCV: 74.8 fL — ABNORMAL LOW (ref 78.0–100.0)
Platelets: 387 10*3/uL (ref 150–400)
RBC: 7.83 MIL/uL — ABNORMAL HIGH (ref 4.22–5.81)
RDW: 19.3 % — AB (ref 11.5–15.5)
WBC: 12.4 10*3/uL — ABNORMAL HIGH (ref 4.0–10.5)

## 2014-06-01 LAB — ERYTHROPOIETIN: Erythropoietin: 2.6 m[IU]/mL (ref 2.6–18.5)

## 2014-06-01 LAB — URIC ACID: URIC ACID, SERUM: 8.1 mg/dL — AB (ref 4.0–7.8)

## 2014-06-01 MED ORDER — ASPIRIN 81 MG PO CHEW: 81.0000 mg | CHEWABLE_TABLET | Freq: Every day | ORAL | Status: AC

## 2014-06-01 MED ORDER — NAPROXEN 500 MG PO TABS: 500.0000 mg | ORAL_TABLET | Freq: Two times a day (BID) | ORAL | Status: DC

## 2014-06-01 MED ORDER — NAPROXEN 500 MG PO TABS
500.0000 mg | ORAL_TABLET | Freq: Two times a day (BID) | ORAL | Status: DC
  Administered 2014-06-01: 500 mg via ORAL
  Filled 2014-06-01 (×3): qty 1

## 2014-06-01 MED ORDER — SODIUM CHLORIDE 0.9 % IV BOLUS (SEPSIS)
500.0000 mL | Freq: Once | INTRAVENOUS | Status: AC
  Administered 2014-06-01: 500 mL via INTRAVENOUS

## 2014-06-01 MED ORDER — SODIUM CHLORIDE 0.9 % IV BOLUS (SEPSIS)
1000.0000 mL | Freq: Once | INTRAVENOUS | Status: AC
  Administered 2014-06-01: 1000 mL via INTRAVENOUS

## 2014-06-01 MED ORDER — DOXYCYCLINE HYCLATE 100 MG PO TABS: 100.0000 mg | ORAL_TABLET | Freq: Two times a day (BID) | ORAL | Status: AC

## 2014-06-01 MED ORDER — OXYCODONE HCL 5 MG PO TABS: 5.0000 mg | ORAL_TABLET | Freq: Three times a day (TID) | ORAL | Status: DC | PRN

## 2014-06-01 NOTE — Telephone Encounter (Signed)
Per staff message I have scheduled appts. JMW  

## 2014-06-01 NOTE — Discharge Summary (Addendum)
Physician Discharge Summary  Deondre Marinaro JKD:[id number] DOB: 04/17/1981 DOA: 05/24/2014  PCP: No primary provider on file.  Admit date: 05/24/2014 Discharge date: 06/01/2014  Time spent: 35 minutes  Recommendations for Outpatient Follow-up:  Discharge home with outpatient follow-up with hematology (Dr. Alvy Bimler) Patient will follow-up with surgery as outpatient for his sebaceous cyst of the back  Discharge Diagnoses:  Principal problem Polycythemia vera  Active Problems:   Cellulitis of scrotum   Myeloproliferative disorder   Acute gouty arthritis  Sebaceous cyst of the back   Discharge Condition: Fair  Diet recommendation: Regular  Disposition: Home with outpatient hematology follow-up   Filed Weights   05/24/14 1629  Weight: 124.921 kg (275 lb 6.4 oz)    History of present illness:  Please refer to admission H&P for details, but in brief, 33 year old healthy male who was admitted for cellulitis of the scrotum with mild edema without any abscess was found to have an incidental finding of polycythemia. On admission his WBC was 13.6 K, RBC of 9.5, hemoglobin of 23 and hematocrit of 70.6 with low MCV and normal platelets. Hematology consulted for further management.  Hospital Course:  Polycythemia vera -Highly concerning for myeloproliferative disorder. Chest x-rays and LFTs are stable. Hemoglobin and hematocrit has been slowly improving since patient has been getting daily phlebotomy since 10/21.  -Patient's hemoglobin today is 19.3 and hematocrit of 58.6. He had elevated reticulocyte while erythropoietin level and JAK2 genotype has been sent and results pending. -Patient Received one unit phlebotomy on 10/21, 2 units on 1022 , 10/23 and 10/25). He is scheduled for another 1 L of phlebotomy after which he can be discharged home with outpatient follow-up with Dr. Alvy Bimler. (Appointment has been scheduled for follow-up tomorrow and get phlebotomy. The goal is to continue with  daily phlebotomy until his hematocrit is less than 48.) -Patient has been started on baby aspirin to prevent thrombosis.  Scrotal cellulitis No signs of abscess on CT scan of the pelvis. Patient on empiric vancomycin and Zosyn which was switched to oral doxycycline. He will complete 2 weeks of oral doxycycline on 11/1.  Acute gouty arthritis/plantar fasciitis Patient noted to have redness and pain with minimal swelling over left lateral ankle on 10/25. Uric acid elevated to 8.1. X-ray done negative for any fractures or injury but comments on possible chronic plantar.  fasciitis. I will discharge him on oral Naprosyn 500 mg twice daily for 5 days.  Leukocytosis WBC in the range of 12-14 K. This was thought initially to be due to scrotal cellulitis but appears to be secondary to possible myeloproliferative disorder.  Sebaceous cyst on the back Patient reports having this for for almost 2 years and is noninfected Terance Hart by general surgery and recommends outpatient follow-up for removal. I have provided the contact number for the surgery office and he can follow-up there in few weeks.  Procedures:  Phlebotomy  Consultations:  Dr. Alvy Bimler  Discharge Exam: Filed Vitals:   06/01/14 0530  BP: 139/79  Pulse: 103  Temp: 98.6 F (37 C)  Resp: 18    General: Middle aged male in no acute distress HEENT: Moist oral mucosa Chest: Clear to auscultation bilaterally Cardiovascular: Normal S1 and S2, no murmurs rub or gallop Abdomen: Soft, nondistended, nontender, bowel sounds present, no organomegaly, mild scrotal swelling which has improved without any tenderness Extremities: Mild erythema over left lateral ankle with minimal swelling and tenderness to movement. 2 centimeter noninfected sebaceous cyst on upper back. CNS: Alert and oriented  Discharge Instructions You were cared for by a hospitalist during your hospital stay. If you have any questions about your discharge medications  or the care you received while you were in the hospital after you are discharged, you can call the unit and asked to speak with the hospitalist on call if the hospitalist that took care of you is not available. Once you are discharged, your primary care physician will handle any further medical issues. Please note that NO REFILLS for any discharge medications will be authorized once you are discharged, as it is imperative that you return to your primary care physician (or establish a relationship with a primary care physician if you do not have one) for your aftercare needs so that they can reassess your need for medications and monitor your lab values.   Current Discharge Medication List    START taking these medications   Details  aspirin 81 MG chewable tablet Chew 1 tablet (81 mg total) by mouth daily. Qty: 30 tablet, Refills: 3    doxycycline (VIBRA-TABS) 100 MG tablet Take 1 tablet (100 mg total) by mouth every 12 (twelve) hours. Qty: 12 tablet, Refills: 0    naproxen (NAPROSYN) 500 MG tablet Take 1 tablet (500 mg total) by mouth 2 (two) times daily with a meal. Qty: 10 tablet, Refills: 0    oxyCODONE (OXY IR/ROXICODONE) 5 MG immediate release tablet Take 1 tablet (5 mg total) by mouth every 8 (eight) hours as needed for moderate pain. Qty: 15 tablet, Refills: 0      STOP taking these medications     acetaminophen (TYLENOL) 500 MG tablet      DM-Doxylamine-Acetaminophen (VICKS NYQUIL COLD & FLU) 15-6.25-325 MG/15ML LIQD      ibuprofen (ADVIL,MOTRIN) 200 MG tablet        No Known Allergies Follow-up Information   Follow up with [facility], NI, MD On 06/02/2014.   Specialty:  Hematology and Oncology   Contact information:   [place] [address] [phone]       Follow up with Merrie Roof, MD. Schedule an appointment as soon as possible for a visit in 4 weeks.   Specialty:  General Surgery   Contact information:   [address] [place] [address] [phone]        The results of significant diagnostics from this hospitalization (including imaging, microbiology, ancillary and laboratory) are listed below for reference.    Significant Diagnostic Studies: Dg Chest 2 View  05/27/2014   CLINICAL DATA:  Cough, shortness of breath.  EXAM: CHEST  2 VIEW  COMPARISON:  None.  FINDINGS: The heart size and mediastinal contours are within normal limits. No pneumothorax or pleural effusion is noted. Right upper lobe airspace opacity is noted concerning for atelectasis or possibly pneumonia. Left lung is clear. The visualized skeletal structures are unremarkable.  IMPRESSION: Right upper lobe airspace opacity is noted concerning for pneumonia or atelectasis. Short-term follow-up radiographs are recommended to ensure resolution to rule out the possibility of underlying neoplasm.   Electronically Signed   By: Sabino Dick M.D.   On: 05/27/2014 11:05   Ct Pelvis W Contrast  05/24/2014   CLINICAL DATA:  Scrotal and groin swelling for the past 3 days. Clinical concern for abscess. This began 4 days ago. Some fever and chills.  EXAM: CT PELVIS WITH CONTRAST  TECHNIQUE: Multidetector CT imaging of the pelvis was performed using the standard protocol following the bolus administration of intravenous  contrast.  CONTRAST:  162mL OMNIPAQUE IOHEXOL 300 MG/ML  SOLN  COMPARISON:  None.  FINDINGS: Diffuse low density scrotal skin thickening. The testicles and epididymi have normal CT appearances. No discrete fluid collections are seen. No intestinal abnormalities. Normal appearing appendix, urinary bladder and prostate gland. Mildly prominent bilateral inguinal lymph nodes. The largest is on the left, with a short axis diameter of 12 mm on image number 47. This has a preserved fatty hilum. Unremarkable bones.  IMPRESSION: Diffuse scrotal cellulitis without abscess.   Electronically Signed   By: Enrique Sack M.D.   On: 05/24/2014 12:45   Dg Foot  2 Views Left  06/01/2014   CLINICAL DATA:  Left foot pain, swelling beginning at approximately 3 a.m. on Friday morning. Swelling and redness at the lateral malleolus. No known injury. Initial caliber.  EXAM: LEFT FOOT - 2 VIEW  COMPARISON:  10/03/2010  FINDINGS: There is irregularity along the inferior aspect of the calcaneus. This has progressed significantly since 2012. The irregular bone appears corticated. While chronic infection cannot be completely excluded, I favor this represents changes of chronic plantar fasciitis.  No additional acute bony abnormality. No fracture, subluxation or dislocation. Mild degenerative changes at the first MTP joint. No soft tissue abnormality.  IMPRESSION: Progressive irregularity along the inferior calcaneus at the plantar fascia insertion. I favor this represents chronic plantar fasciitis.   Electronically Signed   By: Rolm Baptise M.D.   On: 06/01/2014 12:03    Microbiology: Recent Results (from the past 240 hour(s))  CULTURE, BLOOD (ROUTINE X 2)     Status: None   Collection Time    05/24/14  4:07 PM      Result Value Ref Range Status   Specimen Description BLOOD RIGHT HAND   Final   Special Requests     Final   Value: BOTTLES DRAWN AEROBIC AND ANAEROBIC 4CC BOTH BOTTLES   Culture  Setup Time     Final   Value: 05/24/2014 18:50     Performed at [facility]   Culture     Final   Value: NO GROWTH 5 DAYS     Performed at [facility]   Report Status 05/30/2014 FINAL   Final  CULTURE, BLOOD (ROUTINE X 2)     Status: None   Collection Time    05/24/14  4:07 PM      Result Value Ref Range Status   Specimen Description BLOOD RIGHT ARM   Final   Special Requests     Final   Value: BOTTLES DRAWN AEROBIC AND ANAEROBIC 4CC BOTH BOTTLES   Culture  Setup Time     Final   Value: 05/24/2014 18:50     Performed at [facility]   Culture     Final   Value: NO GROWTH 5 DAYS     Performed at [facility]   Report Status  05/30/2014 FINAL   Final  URINE CULTURE     Status: None   Collection Time    05/24/14  5:59 PM      Result Value Ref Range Status   Specimen Description URINE, CLEAN CATCH   Final   Special Requests NONE   Final   Culture  Setup Time     Final   Value: 05/25/2014 00:16     Performed at [place]     Final   Value: NO GROWTH     Performed at [facility]   Culture     Final   Value: NO GROWTH     Performed at [facility]   Report Status 05/26/2014 FINAL   Final     Labs: Basic Metabolic Panel:  Recent Labs Lab 05/28/14 0430  NA 137  K 4.0  CL 101  CO2 22  GLUCOSE 154*  BUN 9  CREATININE 0.89  CALCIUM 8.9   Liver Function Tests: No results found for this basename: AST, ALT, ALKPHOS, BILITOT, PROT, ALBUMIN,  in the last 168 hours No results found for this basename: LIPASE, AMYLASE,  in the last 168 hours No results found for this basename: AMMONIA,  in the last 168 hours CBC:  Recent Labs Lab 05/28/14 0430 05/29/14 0529 05/30/14 0548 05/31/14 0454 06/01/14 0350  WBC 11.9* 12.5* 13.3* 13.5* 12.4*  NEUTROABS  --  10.0* 10.8* 10.6*  --   HGB 21.5* 22.0* 19.9* 19.9* 19.3*  HCT 69.5* 70.0* 61.1* 60.1* 58.6*  MCV 76.8* 76.3* 75.1* 74.5* 74.8*  PLT 300 350 398 353 387   Cardiac Enzymes: No results found for this basename: CKTOTAL, CKMB, CKMBINDEX, TROPONINI,  in the last 168 hours BNP: BNP (last 3 results) No results found for this basename: PROBNP,  in the last 8760 hours CBG: No results found for this basename: GLUCAP,  in the last 168 hours     Signed:  Windsor Goeken, [place]  [facility] 06/01/2014, 1:17 PM

## 2014-06-01 NOTE — Progress Notes (Signed)
1000cc therapeutic phlebotomy performed. Pt tolerated well. Post BP 147/97 HR 98. Floor RN at bedside aware.

## 2014-06-01 NOTE — Progress Notes (Signed)
Robert Avila   DOB:22-Jul-1981   VP#:[id number]    I have seen the patient, examined him and edited the notes as follows  Subjective: He feels well. Denies chest pain, shortness of breath or dizziness after phlebotomy. he denies any headaches, vision changes, itching.  Scheduled Meds: . aspirin  81 mg Oral Daily  . doxycycline  100 mg Oral Q12H  . heparin  5,000 Units Subcutaneous 3 times per day  . sodium chloride  1,000 mL Intravenous Once  . sodium chloride  500 mL Intravenous Once  . sodium chloride  500 mL Intravenous Once  . sodium chloride  3 mL Intravenous Q12H   Continuous Infusions:  PRN Meds:.sodium chloride, acetaminophen, acetaminophen, hydrALAZINE, ibuprofen, morphine injection, oxyCODONE, sodium chloride  Objective:  Filed Vitals:   06/01/14 0530  BP: 139/79  Pulse: 103  Temp: 98.6 F (37 C)  Resp: 18     Intake/Output Summary (Last 24 hours) at 06/01/14 5462 Last data filed at 05/31/14 2055  Gross per 24 hour  Intake   1323 ml  Output   1000 ml  Net    323 ml    GENERAL:alert, no distress and comfortable except for left ankle pain SKIN: skin color, texture, turgor are normal, no rashes or significant lesions EYES: normal, Conjunctiva are pink and non-injected, sclera clear OROPHARYNX:no exudate, no erythema and lips, buccal mucosa, and tongue normal  NECK: supple, thyroid normal size, non-tender, without nodularity LYMPH:  no palpable lymphadenopathy in the cervical, axillary or inguinal LUNGS: clear to auscultation and percussion with normal breathing effort HEART: regular rate & rhythm and no murmurs and no lower extremity edema ABDOMEN:abdomen soft, non-tender and normal bowel sounds Musculoskeletal:no cyanosis of digits and no clubbing. Left ankle tender to palpation without swelling. NEURO: alert & oriented x 3 with fluent speech, no focal motor/sensory deficits   Labs:  Lab Results  Component Value Date   WBC 12.4* 06/01/2014   HGB 19.3*  06/01/2014   HCT 58.6* 06/01/2014   MCV 74.8* 06/01/2014   PLT 387 06/01/2014   NEUTROABS 10.6* 05/31/2014    Lab Results  Component Value Date   NA 137 05/28/2014   K 4.0 05/28/2014   CL 101 05/28/2014   CO2 22 05/28/2014   Iron/TIBC/Ferritin/ %Sat    Component Value Date/Time   FERRITIN 130 05/28/2014 0430   Assessment & Plan:  #1 Erythrocytosis  Likely secondary to myeloproliferative disorder. Ferritin and Sed rate were normal. JAK 2 genotype and erythropoietin level pending.  Phlebotomy was initiated on phlebotomy on 10/21, with 1-2 units of blood removed daily with saline replacements.  Goal would be to get his hematocrit under 48.  He is on aspirin 81 mg daily to prevent risk of thrombosis.   #2 Leukocytosis  This is likely reactive in the setting of scrotal cellulitis and dehydration and possibly related to myeloproliferative disorder Values are now trending down.  #3 Scrotal Cellulitis  On oral antibiotics with doxycycline per primary team  No signs of abscess or hematoma on imaging  #DVT prophylaxis On Heparin sq  #4 Discharge planning Plan to DC today and he will get the rest of treatment as out-patient. Appointment given to patient to show up at 10.15 am tomorrow  Will sign off   Elease Hashimoto 06/01/2014  7:04 AM  Latorya Bautch, MD 06/01/2014

## 2014-06-01 NOTE — Discharge Instructions (Signed)
Polycythemia Vera   Polycythemia Vera is a condition in which the body makes too many red blood cells and there is no known cause. The red blood cells (erythrocytes) are the cells which carry the oxygen in your blood stream to the cells of your body. Because of the increased red blood cells, the blood becomes thicker and does not circulate as well. It would be similar to your car having oil which is too thick so it cannot start and circulate as well. When the blood is too thick it often causes headaches and dizziness. It may also cause blood clots. Even though the blood clots easier, these patients bleed easier. The bleeding is caused because the blood cells which help stop bleeding (platelets) do not function normally. It occurs in all age groups but is more common in the 50 to 70 year age range.  TREATMENT   The treatment of polycythemia vera for many years has been blood removal (phlebotomy) which is similar to blood removal in a blood bank, however this blood is not used for donation. Hydroxyurea is used to supplement phlebotomy. Aspirin is commonly given to thin the blood as long as the patient does not have a problem with bleeding. Other drugs are used based on the progression of the disease.  Document Released: 04/18/2001 Document Revised: 10/16/2011 Document Reviewed: 10/23/2008  ExitCare® Patient Information ©2015 [employer]. This information is not intended to replace advice given to you by your health care provider. Make sure you discuss any questions you have with your health care provider.

## 2014-06-01 NOTE — Plan of Care (Signed)
Problem: Discharge Progression Outcomes Goal: Discharge plan in place and appropriate Outcome: Completed/Met Date Met:  06/01/14 Discharged to wife via wheelchair. Discharge instructions explained to wife and pt.     

## 2014-06-02 ENCOUNTER — Telehealth: Payer: Self-pay | Admitting: *Deleted

## 2014-06-02 ENCOUNTER — Encounter: Payer: Self-pay | Admitting: Hematology and Oncology

## 2014-06-02 ENCOUNTER — Encounter (INDEPENDENT_AMBULATORY_CARE_PROVIDER_SITE_OTHER): Payer: Self-pay

## 2014-06-02 ENCOUNTER — Ambulatory Visit: Payer: Self-pay

## 2014-06-02 ENCOUNTER — Other Ambulatory Visit (HOSPITAL_BASED_OUTPATIENT_CLINIC_OR_DEPARTMENT_OTHER): Payer: Self-pay

## 2014-06-02 ENCOUNTER — Ambulatory Visit (HOSPITAL_BASED_OUTPATIENT_CLINIC_OR_DEPARTMENT_OTHER): Payer: Self-pay

## 2014-06-02 ENCOUNTER — Other Ambulatory Visit: Payer: Self-pay | Admitting: Hematology and Oncology

## 2014-06-02 ENCOUNTER — Ambulatory Visit (HOSPITAL_BASED_OUTPATIENT_CLINIC_OR_DEPARTMENT_OTHER): Payer: Self-pay | Admitting: Hematology and Oncology

## 2014-06-02 ENCOUNTER — Telehealth: Payer: Self-pay | Admitting: Hematology and Oncology

## 2014-06-02 VITALS — BP 128/88 | HR 102 | Temp 98.6°F | Resp 18 | Ht 73.0 in | Wt 263.0 lb

## 2014-06-02 VITALS — BP 133/79 | HR 105 | Temp 98.9°F | Resp 16

## 2014-06-02 DIAGNOSIS — D751 Secondary polycythemia: Secondary | ICD-10-CM

## 2014-06-02 DIAGNOSIS — D471 Chronic myeloproliferative disease: Secondary | ICD-10-CM

## 2014-06-02 LAB — CBC WITH DIFFERENTIAL/PLATELET
BASO%: 0 % (ref 0.0–2.0)
BASOS ABS: 0 10*3/uL (ref 0.0–0.1)
EOS%: 3.9 % (ref 0.0–7.0)
Eosinophils Absolute: 0.5 10*3/uL (ref 0.0–0.5)
HEMATOCRIT: 61.2 % — AB (ref 38.4–49.9)
HGB: 18.9 g/dL — ABNORMAL HIGH (ref 13.0–17.1)
LYMPH%: 6.9 % — ABNORMAL LOW (ref 14.0–49.0)
MCH: 23.5 pg — AB (ref 27.2–33.4)
MCHC: 30.9 g/dL — ABNORMAL LOW (ref 32.0–36.0)
MCV: 76.3 fL — AB (ref 79.3–98.0)
MONO#: 0.6 10*3/uL (ref 0.1–0.9)
MONO%: 4 % (ref 0.0–14.0)
NEUT#: 11.9 10*3/uL — ABNORMAL HIGH (ref 1.5–6.5)
NEUT%: 85.2 % — AB (ref 39.0–75.0)
Platelets: 518 10*3/uL — ABNORMAL HIGH (ref 140–400)
RBC: 8.02 10*6/uL — ABNORMAL HIGH (ref 4.20–5.82)
RDW: 20 % — AB (ref 11.0–14.6)
WBC: 13.9 10*3/uL — ABNORMAL HIGH (ref 4.0–10.3)
lymph#: 1 10*3/uL (ref 0.9–3.3)

## 2014-06-02 LAB — TECHNOLOGIST REVIEW

## 2014-06-02 NOTE — Progress Notes (Signed)
[facility] OFFICE PROGRESS NOTE  Patient Care Team: No Pcp Per Patient as PCP - General (General Practice)  SUMMARY OF ONCOLOGIC HISTORY: He was found to have abnormal CBC on admission with leukocytosis, erythrocytosis but with normal platelet count on recent hospitalization. Polycythemia was suspected.   He denies fever or chills, but does have frequent night sweats. He has intermittent headaches. He denies chest pain or shortness of breath, but had frequent leg cramps. He had symptoms of erythromyalgias. Denies a history of arthritis, kidney disease or gout. He denies any vision changes. He denies testosterone use. Was never diagnosed with congenital heart disease.  He never suffer from diagnosis of blood clot but does have family history of stroke in his father and paternal uncle. He does not take any aspirin products.There is no prior diagnosis of obstructive sleep apnea. The patient denies weight loss or pruritus.The patient is an occasional smoker consuming 1 cigar every 4 or 5 days, but does work in a [employer] and is exposed to second hand smoking by his wife.  On admission, his CBC showed a Hb of 22.4, Hct of 71.4 with a WBC of 17.2. platelets were normal. The patient had numerous phlebotomy session while hospitalized recently. INTERVAL HISTORY: Please see below for problem oriented charting. He feels well. Denies recent headache.  REVIEW OF SYSTEMS:   Constitutional: Denies fevers, chills or abnormal weight loss Eyes: Denies blurriness of vision Ears, nose, mouth, throat, and face: Denies mucositis or sore throat Respiratory: Denies cough, dyspnea or wheezes Cardiovascular: Denies palpitation, chest discomfort or lower extremity swelling Gastrointestinal:  Denies nausea, heartburn or change in bowel habits Skin: Denies abnormal skin rashes Lymphatics: Denies new lymphadenopathy  Neurological:Denies numbness, tingling or new weaknesses Behavioral/Psych: Mood is  stable, no new changes  All other systems were reviewed with the patient and are negative.  I have reviewed the past medical history, past surgical history, social history and family history with the patient and they are unchanged from previous note.  ALLERGIES:  has No Known Allergies.  MEDICATIONS:  Current Outpatient Prescriptions  Medication Sig Dispense Refill  . aspirin 81 MG chewable tablet Chew 1 tablet (81 mg total) by mouth daily.  30 tablet  3  . doxycycline (VIBRA-TABS) 100 MG tablet Take 1 tablet (100 mg total) by mouth every 12 (twelve) hours.  12 tablet  0  . ibuprofen (ADVIL,MOTRIN) 200 MG tablet Take 200 mg by mouth every 6 (six) hours as needed.      . naproxen (NAPROSYN) 500 MG tablet Take 1 tablet (500 mg total) by mouth 2 (two) times daily with a meal.  10 tablet  0   No current facility-administered medications for this visit.    PHYSICAL EXAMINATION: ECOG PERFORMANCE STATUS: 0 - Asymptomatic  Filed Vitals:   06/02/14 1140  BP: 128/88  Pulse: 102  Temp: 98.6 F (37 C)  Resp: 18   Filed Weights   06/02/14 1140  Weight: 263 lb (119.296 kg)    GENERAL:alert, no distress and comfortable SKIN: skin color, texture, turgor are normal, no rashes or significant lesions. Noted numerous phlebotomy sites. EYES: normal, Conjunctiva are pink and non-injected, sclera clear Musculoskeletal:no cyanosis of digits and no clubbing  NEURO: alert & oriented x 3 with fluent speech, no focal motor/sensory deficits  LABORATORY DATA:  I have reviewed the data as listed    Component Value Date/Time   NA 137 05/28/2014 0430   K 4.0 05/28/2014 0430   CL 101  05/28/2014 0430   CO2 22 05/28/2014 0430   GLUCOSE 154* 05/28/2014 0430   BUN 9 05/28/2014 0430   CREATININE 0.89 05/28/2014 0430   CALCIUM 8.9 05/28/2014 0430   PROT 6.5 05/24/2014 1056   ALBUMIN 2.5* 05/24/2014 1056   AST 19 05/24/2014 1056   ALT 26 05/24/2014 1056   ALKPHOS 158* 05/24/2014 1056   BILITOT 1.0  05/24/2014 1056   GFRNONAA >90 05/28/2014 0430   GFRAA >90 05/28/2014 0430    No results found for this basename: SPEP, UPEP,  kappa and lambda light chains    Lab Results  Component Value Date   WBC 13.9* 06/02/2014   NEUTROABS 11.9* 06/02/2014   HGB 18.9* 06/02/2014   HCT 61.2* 06/02/2014   MCV 76.3* 06/02/2014   PLT 518* 06/02/2014      Chemistry      Component Value Date/Time   NA 137 05/28/2014 0430   K 4.0 05/28/2014 0430   CL 101 05/28/2014 0430   CO2 22 05/28/2014 0430   BUN 9 05/28/2014 0430   CREATININE 0.89 05/28/2014 0430      Component Value Date/Time   CALCIUM 8.9 05/28/2014 0430   ALKPHOS 158* 05/24/2014 1056   AST 19 05/24/2014 1056   ALT 26 05/24/2014 1056   BILITOT 1.0 05/24/2014 1056       RADIOGRAPHIC STUDIES: I have personally reviewed the radiological images as listed and agreed with the findings in the report. Dg Foot 2 Views Left  06/01/2014   CLINICAL DATA:  Left foot pain, swelling beginning at approximately 3 a.m. on Friday morning. Swelling and redness at the lateral malleolus. No known injury. Initial caliber.  EXAM: LEFT FOOT - 2 VIEW  COMPARISON:  10/03/2010  FINDINGS: There is irregularity along the inferior aspect of the calcaneus. This has progressed significantly since 2012. The irregular bone appears corticated. While chronic infection cannot be completely excluded, I favor this represents changes of chronic plantar fasciitis.  No additional acute bony abnormality. No fracture, subluxation or dislocation. Mild degenerative changes at the first MTP joint. No soft tissue abnormality.  IMPRESSION: Progressive irregularity along the inferior calcaneus at the plantar fascia insertion. I favor this represents chronic plantar fasciitis.   Electronically Signed   By: Rolm Baptise M.D.   On: 06/01/2014 12:03     ASSESSMENT & PLAN:  Polycythemia, secondary I suspect he may have myeloproliferative disorder with polycythemia vera. Peripheral  blood for JAK2 mutation is pending. In the meantime, I recommend phlebotomy twice a week to get his hemoglobin down to around 16 g and hematocrit less than 48. The patient will continue on aspirin.   All questions were answered. The patient knows to call the clinic with any problems, questions or concerns. No barriers to learning was detected. I spent 25 minutes counseling the patient face to face. The total time spent in the appointment was 30 minutes and more than 50% was on counseling and review of test results     [facility], [place], MD 06/02/2014 4:43 PM

## 2014-06-02 NOTE — Patient Instructions (Signed)

## 2014-06-02 NOTE — Progress Notes (Signed)
Checked in new patient with no financial issues prior to seeing the dr. He has my card and will call when he gets his medicaid-he has already filled out and know self pay now. He [place] not been out of the [place] and he has appt card

## 2014-06-02 NOTE — Progress Notes (Signed)
Phlebotomy performed from [id number] without complications. Double phlebotomy performed via 16 1/2 60 mL syringes.

## 2014-06-02 NOTE — Progress Notes (Signed)
Patient given cool compress to forehead after phlebotomy.  C/O hot and facial perspiration noted.  At 1435 VSS, drinking tea and eating cheese and crackers.

## 2014-06-02 NOTE — Telephone Encounter (Signed)
Gave avs & cal for Nov & April 2016. Sent MW mess to sch tx.

## 2014-06-02 NOTE — Progress Notes (Signed)
Discharged at 1455, ambulatory in no distress with mom.

## 2014-06-02 NOTE — Telephone Encounter (Signed)
Per staff message and POF I have scheduled appts. Advised scheduler of appts. JMW  

## 2014-06-02 NOTE — Assessment & Plan Note (Signed)
I suspect he may have myeloproliferative disorder with polycythemia vera. Peripheral blood for JAK2 mutation is pending. In the meantime, I recommend phlebotomy twice a week to get his hemoglobin down to around 16 g and hematocrit less than 48. The patient will continue on aspirin.

## 2014-06-03 LAB — JAK2 GENOTYPR: JAK2 GenotypR: DETECTED — AB

## 2014-06-04 ENCOUNTER — Telehealth: Payer: Self-pay | Admitting: *Deleted

## 2014-06-04 NOTE — Telephone Encounter (Signed)
Left Vm for pt to call nurse back for test results or he can ask when he is here for appts tomorrow.

## 2014-06-04 NOTE — Telephone Encounter (Signed)
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

## 2014-06-05 ENCOUNTER — Other Ambulatory Visit (HOSPITAL_BASED_OUTPATIENT_CLINIC_OR_DEPARTMENT_OTHER): Payer: Self-pay

## 2014-06-05 ENCOUNTER — Ambulatory Visit (HOSPITAL_BASED_OUTPATIENT_CLINIC_OR_DEPARTMENT_OTHER): Payer: Self-pay

## 2014-06-05 VITALS — BP 131/73 | HR 100 | Temp 98.3°F | Resp 18

## 2014-06-05 DIAGNOSIS — D471 Chronic myeloproliferative disease: Secondary | ICD-10-CM

## 2014-06-05 DIAGNOSIS — D751 Secondary polycythemia: Secondary | ICD-10-CM

## 2014-06-05 LAB — CBC WITH DIFFERENTIAL/PLATELET
BASO%: 1.1 % (ref 0.0–2.0)
Basophils Absolute: 0.2 10*3/uL — ABNORMAL HIGH (ref 0.0–0.1)
EOS%: 3.4 % (ref 0.0–7.0)
Eosinophils Absolute: 0.5 10*3/uL (ref 0.0–0.5)
HCT: 53.3 % — ABNORMAL HIGH (ref 38.4–49.9)
HGB: 16.6 g/dL (ref 13.0–17.1)
LYMPH%: 9.1 % — AB (ref 14.0–49.0)
MCH: 23.5 pg — ABNORMAL LOW (ref 27.2–33.4)
MCHC: 31.1 g/dL — AB (ref 32.0–36.0)
MCV: 75.6 fL — ABNORMAL LOW (ref 79.3–98.0)
MONO#: 0.5 10*3/uL (ref 0.1–0.9)
MONO%: 3.2 % (ref 0.0–14.0)
NEUT#: 12.2 10*3/uL — ABNORMAL HIGH (ref 1.5–6.5)
NEUT%: 83.2 % — ABNORMAL HIGH (ref 39.0–75.0)
PLATELETS: 519 10*3/uL — AB (ref 140–400)
RBC: 7.05 10*6/uL — ABNORMAL HIGH (ref 4.20–5.82)
RDW: 19.5 % — ABNORMAL HIGH (ref 11.0–14.6)
WBC: 14.7 10*3/uL — ABNORMAL HIGH (ref 4.0–10.3)
lymph#: 1.3 10*3/uL (ref 0.9–3.3)

## 2014-06-05 LAB — TECHNOLOGIST REVIEW

## 2014-06-05 NOTE — Progress Notes (Signed)
Performed therapeutic phlebotomy x 2 units per verbal order from Dr. Alvy Bimler.  Phlebotomy set used to left [employer] and right AC without difficulty, each over approx 5 minutes. Pt provided with snacks and ginger ale. Denies any distressing symptoms. Will monitor patient x 30 minutes and obtain post VS prior to discharge.

## 2014-06-05 NOTE — Patient Instructions (Signed)

## 2014-06-09 ENCOUNTER — Other Ambulatory Visit (HOSPITAL_BASED_OUTPATIENT_CLINIC_OR_DEPARTMENT_OTHER): Payer: Self-pay

## 2014-06-09 ENCOUNTER — Ambulatory Visit (HOSPITAL_BASED_OUTPATIENT_CLINIC_OR_DEPARTMENT_OTHER): Payer: Self-pay

## 2014-06-09 ENCOUNTER — Encounter: Payer: Self-pay | Admitting: *Deleted

## 2014-06-09 DIAGNOSIS — D751 Secondary polycythemia: Secondary | ICD-10-CM

## 2014-06-09 LAB — CBC WITH DIFFERENTIAL/PLATELET
BASO%: 0.3 % (ref 0.0–2.0)
Basophils Absolute: 0 10*3/uL (ref 0.0–0.1)
EOS%: 4.2 % (ref 0.0–7.0)
Eosinophils Absolute: 0.5 10*3/uL (ref 0.0–0.5)
HCT: 50.5 % — ABNORMAL HIGH (ref 38.4–49.9)
HGB: 15.8 g/dL (ref 13.0–17.1)
LYMPH#: 1 10*3/uL (ref 0.9–3.3)
LYMPH%: 8.1 % — ABNORMAL LOW (ref 14.0–49.0)
MCH: 23.3 pg — AB (ref 27.2–33.4)
MCHC: 31.2 g/dL — ABNORMAL LOW (ref 32.0–36.0)
MCV: 74.7 fL — ABNORMAL LOW (ref 79.3–98.0)
MONO#: 0.7 10*3/uL (ref 0.1–0.9)
MONO%: 5.6 % (ref 0.0–14.0)
NEUT#: 10.3 10*3/uL — ABNORMAL HIGH (ref 1.5–6.5)
NEUT%: 81.8 % — AB (ref 39.0–75.0)
Platelets: 513 10*3/uL — ABNORMAL HIGH (ref 140–400)
RBC: 6.77 10*6/uL — ABNORMAL HIGH (ref 4.20–5.82)
RDW: 19 % — AB (ref 11.0–14.6)
WBC: 12.6 10*3/uL — ABNORMAL HIGH (ref 4.0–10.3)

## 2014-06-09 NOTE — Patient Instructions (Addendum)
Complete Blood Count A complete blood count is a group of tests that measures several characteristics of the three types of cells in your blood. The liquid portion of your blood (plasma) is not used in these tests. Irregularities found in results from these tests can indicate different conditions, such as anemia, infections, bleeding problems, and cancers. The blood tests included in a complete blood count can be broken down into the cell types that they examine and what they measure:   White blood cells.  White blood cell count. This is a measurement of the number of white blood cells in a standard volume (concentration) in your blood sample.  White blood cell differential. This identifies the types of white blood cells and the concentration of each in the sample of your blood. There are five different types of white blood cells. They all help you fight infection but in different ways. The differential also identifies immature white blood cells.  Red blood cells.  Red blood cell count. This is a measurement of the concentration of red blood cells in your blood sample.  Hemoglobin. This is a measurement of the amount of hemoglobin in the sample of your blood. This measurement indicates your blood's overall oxygen-carrying capacity.  Hematocrit. This is a measurement of the percentage of space that the red blood cells take up in your blood sample.  Mean corpuscular volume. This is a measurement of the average size of your red blood cells.  Mean corpuscular hemoglobin. This is a measurement of the average amount of hemoglobin inside each of your red blood cells.  Mean corpuscular hemoglobin concentration. This is a calculation of the average concentration of hemoglobin inside each of your red blood cells in your blood sample.  Red blood cell distribution width. This is a measurement of the variation in the size of your red blood cells.  Platelets.  The platelet count. This is a measurement  of the concentration of platelets in your blood sample.  Mean platelet volume. This is a measurement of the average size of the platelets in your blood sample. RESULTS It is your responsibility to obtain your test results. Ask the laboratory or department performing the test when and how you will get your results. Contact your health care provider to discuss any questions you have about your results. Results outside of normal ranges can be an indication of an illness. Examples of abnormal results and possible causes are listed as follows:   White blood cells.  An abnormally low concentration of white blood cells can be caused by certain infections and by conditions that interfere with white blood cell production that happens in the inner part of your bone (bone marrow).  An abnormally high concentration of white blood cells often indicates infections and conditions that cause inflammation. It can also be an indication of blood-related cancer.  Immature white blood cells can indicate an infection or an abnormal condition affecting your bone marrow.  Red blood cells.  An abnormally low concentration of red blood cells, hemoglobin, or hematocrit is called anemia.  An abnormally high concentration of red blood cells, hemoglobin, or hematocrit is called polycythemia. Abnormally high levels of red blood cells can indicate mild thalassemia. Thalassemia is a type of anemia that is passed down through families (hereditary).  When your mean corpuscular volume is abnormally low, your red blood cells are smaller than normal. This can be caused by thalassemia or iron deficiency anemia. Iron deficiency anemia is a type of anemia that is the  result of a deficiency of a nutrient (deficiency anemia). In this case, the nutrient is iron.  An abnormally high mean corpuscular volume means your red blood cells are larger than normal. This can indicate a deficiency anemia caused by a lack of vitamin B12. An  abnormally high mean corpuscular volume also can be caused by a lot of new red blood cells in your blood. This can happen after you have suddenly lost a lot of blood.  Abnormally low mean corpuscular hemoglobin concentration can indicate conditions in which your hemoglobin is abnormally diluted inside the red cells. Examples of these conditions are iron deficiency anemia and thalassemia.  An abnormally high mean corpuscular hemoglobin concentration can indicate the presence of certain hemolytic anemias. Hemolytic anemia is anemia that results from the abnormal breakdown of your red blood cells.  Red cell distribution width is abnormally increased in certain anemias, when new red blood cells are produced after acute blood loss, and with severe thalassemia.  Platelets.  An abnormally low concentration of platelets can be a sign of a bleeding disorder.  An abnormally high concentration of platelets can occur with iron deficiency anemia, inflammatory disorders, and cancers, or result from physical stresses, such as exercise or blood loss. However, it may also be a sign of a clotting disorder.  New platelets are larger than old platelets. An abnormally high mean platelet volume occurs with a large increase in the number of new platelets being produced by your bone marrow. This can happen after the loss of a large amount of blood or the destruction of your platelets by antibodies. An abnormally high mean platelet volume also can occur with certain bone marrow cancers. Document Released: 08/26/2004 Document Revised: 12/08/2013 Document Reviewed: 12/06/2011 [facility] Patient Information 2015 [place], [place]. This information is not intended to replace advice given to you by your health care provider. Make sure you discuss any questions you have with your health care provider. Polycythemia Vera  Polycythemia Vanita Ingles is a condition in which the body makes too many red blood cells and there is no known cause. The  red blood cells (erythrocytes) are the cells which carry the oxygen in your blood stream to the cells of your body. Because of the increased red blood cells, the blood becomes thicker and does not circulate as well. It would be similar to your car having oil which is too thick so it cannot start and circulate as well. When the blood is too thick it often causes headaches and dizziness. It may also cause blood clots. Even though the blood clots easier, these patients bleed easier. The bleeding is caused because the blood cells which help stop bleeding (platelets) do not function normally. It occurs in all age groups but is more common in the 62 to 47 year age range. TREATMENT  The treatment of polycythemia vera for many years has been blood removal (phlebotomy) which is similar to blood removal in a blood bank, however this blood is not used for donation. Hydroxyurea is used to supplement phlebotomy. Aspirin is commonly given to thin the blood as long as the patient does not have a problem with bleeding. Other drugs are used based on the progression of the disease. Document Released: 04/18/2001 Document Revised: 10/16/2011 Document Reviewed: 10/23/2008 [facility] Patient Information 2015 [place], [place]. This information is not intended to replace advice given to you by your health care provider. Make sure you discuss any questions you have with your health care provider.

## 2014-06-09 NOTE — Progress Notes (Signed)
Robert Avila presents today for phlebotomy per MD orders. Phlebotomy procedure started at 0930 and ended at 1030. 1000 grams removed. Patient observed for 30 minutes after procedure without any incident. Patient tolerated procedure well. IV needle removed intact.

## 2014-06-12 ENCOUNTER — Telehealth: Payer: Self-pay | Admitting: Hematology and Oncology

## 2014-06-12 ENCOUNTER — Other Ambulatory Visit (HOSPITAL_BASED_OUTPATIENT_CLINIC_OR_DEPARTMENT_OTHER): Payer: Self-pay

## 2014-06-12 DIAGNOSIS — D751 Secondary polycythemia: Secondary | ICD-10-CM

## 2014-06-12 LAB — CBC WITH DIFFERENTIAL/PLATELET
BASO%: 1.5 % (ref 0.0–2.0)
BASOS ABS: 0.2 10*3/uL — AB (ref 0.0–0.1)
EOS%: 5.2 % (ref 0.0–7.0)
Eosinophils Absolute: 0.6 10*3/uL — ABNORMAL HIGH (ref 0.0–0.5)
HCT: 47.9 % (ref 38.4–49.9)
HEMOGLOBIN: 14.9 g/dL (ref 13.0–17.1)
LYMPH#: 1.1 10*3/uL (ref 0.9–3.3)
LYMPH%: 9.7 % — ABNORMAL LOW (ref 14.0–49.0)
MCH: 23.5 pg — ABNORMAL LOW (ref 27.2–33.4)
MCHC: 31.1 g/dL — ABNORMAL LOW (ref 32.0–36.0)
MCV: 75.7 fL — ABNORMAL LOW (ref 79.3–98.0)
MONO#: 0.6 10*3/uL (ref 0.1–0.9)
MONO%: 5.4 % (ref 0.0–14.0)
NEUT#: 8.5 10*3/uL — ABNORMAL HIGH (ref 1.5–6.5)
NEUT%: 78.2 % — ABNORMAL HIGH (ref 39.0–75.0)
Platelets: 466 10*3/uL — ABNORMAL HIGH (ref 140–400)
RBC: 6.33 10*6/uL — ABNORMAL HIGH (ref 4.20–5.82)
RDW: 18 % — AB (ref 11.0–14.6)
WBC: 10.8 10*3/uL — ABNORMAL HIGH (ref 4.0–10.3)

## 2014-06-12 NOTE — Telephone Encounter (Signed)
added appt per staff....per staff pt aware °

## 2014-06-12 NOTE — Telephone Encounter (Signed)
Reviewed his CBC. No phlebotomy is required today. Patient will have CBC rechecked on 12/28

## 2014-06-18 ENCOUNTER — Other Ambulatory Visit: Payer: Self-pay | Admitting: Hematology and Oncology

## 2014-08-03 ENCOUNTER — Other Ambulatory Visit: Payer: Self-pay | Admitting: Hematology and Oncology

## 2014-08-03 ENCOUNTER — Telehealth: Payer: Self-pay | Admitting: *Deleted

## 2014-08-03 ENCOUNTER — Other Ambulatory Visit (HOSPITAL_BASED_OUTPATIENT_CLINIC_OR_DEPARTMENT_OTHER): Payer: Self-pay

## 2014-08-03 DIAGNOSIS — D751 Secondary polycythemia: Secondary | ICD-10-CM

## 2014-08-03 LAB — CBC WITH DIFFERENTIAL/PLATELET
BASO%: 0 % (ref 0.0–2.0)
Basophils Absolute: 0 10*3/uL (ref 0.0–0.1)
EOS ABS: 0.6 10*3/uL — AB (ref 0.0–0.5)
EOS%: 3.7 % (ref 0.0–7.0)
HEMATOCRIT: 51.5 % — AB (ref 38.4–49.9)
HEMOGLOBIN: 15.7 g/dL (ref 13.0–17.1)
LYMPH%: 8.8 % — ABNORMAL LOW (ref 14.0–49.0)
MCH: 19.8 pg — ABNORMAL LOW (ref 27.2–33.4)
MCHC: 30.4 g/dL — AB (ref 32.0–36.0)
MCV: 65.2 fL — ABNORMAL LOW (ref 79.3–98.0)
MONO#: 0.4 10*3/uL (ref 0.1–0.9)
MONO%: 2.7 % (ref 0.0–14.0)
NEUT%: 84.8 % — ABNORMAL HIGH (ref 39.0–75.0)
NEUTROS ABS: 13.5 10*3/uL — AB (ref 1.5–6.5)
PLATELETS: 457 10*3/uL — AB (ref 140–400)
RBC: 7.89 10*6/uL — ABNORMAL HIGH (ref 4.20–5.82)
RDW: 19.8 % — ABNORMAL HIGH (ref 11.0–14.6)
WBC: 15.9 10*3/uL — AB (ref 4.0–10.3)
lymph#: 1.4 10*3/uL (ref 0.9–3.3)

## 2014-08-03 NOTE — Telephone Encounter (Signed)
-----   Message from Heath Lark, MD sent at 08/03/2014  9:32 AM EST ----- Regarding: CBC He needs phlebotomy again. Can you ask him when can he come back for this?

## 2014-08-03 NOTE — Telephone Encounter (Signed)
Pt states he can come back one day next week.

## 2014-08-03 NOTE — Telephone Encounter (Signed)
Per staff message and POF I have scheduled appts. Advised scheduler of appts. JMW  

## 2014-08-04 ENCOUNTER — Telehealth: Payer: Self-pay | Admitting: *Deleted

## 2014-08-04 NOTE — Telephone Encounter (Signed)
Per staff message and POF I have scheduled appts. Advised scheduler of appts. JMW  

## 2014-08-04 NOTE — Telephone Encounter (Signed)
Notified of phlebotomy 1/6 @ 0800

## 2014-08-12 ENCOUNTER — Other Ambulatory Visit: Payer: Self-pay | Admitting: Medical Oncology

## 2014-08-12 ENCOUNTER — Ambulatory Visit (HOSPITAL_BASED_OUTPATIENT_CLINIC_OR_DEPARTMENT_OTHER): Payer: Self-pay

## 2014-08-12 DIAGNOSIS — D751 Secondary polycythemia: Secondary | ICD-10-CM

## 2014-08-12 NOTE — Patient Instructions (Signed)

## 2014-08-12 NOTE — Progress Notes (Signed)
[id number]: 1000 grams removed from patient (R) A/C with 20G needle. Patient tolerated well. Drinks/snacks provided before and after phlebotomy. Observed for 30 minutes post.

## 2014-09-04 ENCOUNTER — Encounter (HOSPITAL_COMMUNITY): Payer: Self-pay | Admitting: *Deleted

## 2014-09-04 ENCOUNTER — Emergency Department (HOSPITAL_COMMUNITY): Payer: Self-pay

## 2014-09-04 ENCOUNTER — Ambulatory Visit (HOSPITAL_COMMUNITY): Payer: Self-pay

## 2014-09-04 ENCOUNTER — Observation Stay (HOSPITAL_COMMUNITY): Payer: Self-pay

## 2014-09-04 ENCOUNTER — Observation Stay (HOSPITAL_COMMUNITY)
Admission: EM | Admit: 2014-09-04 | Discharge: 2014-09-05 | Disposition: A | Discharge status: Home / Self Care | Payer: Self-pay | Attending: Family Medicine | Admitting: Family Medicine

## 2014-09-04 DIAGNOSIS — M79606 Pain in leg, unspecified: Secondary | ICD-10-CM | POA: Diagnosis present

## 2014-09-04 DIAGNOSIS — I10 Essential (primary) hypertension: Secondary | ICD-10-CM | POA: Insufficient documentation

## 2014-09-04 DIAGNOSIS — M79651 Pain in right thigh: Secondary | ICD-10-CM | POA: Insufficient documentation

## 2014-09-04 DIAGNOSIS — M79652 Pain in left thigh: Secondary | ICD-10-CM | POA: Insufficient documentation

## 2014-09-04 DIAGNOSIS — M545 Low back pain, unspecified: Secondary | ICD-10-CM

## 2014-09-04 DIAGNOSIS — Z791 Long term (current) use of non-steroidal anti-inflammatories (NSAID): Secondary | ICD-10-CM | POA: Insufficient documentation

## 2014-09-04 DIAGNOSIS — Z7982 Long term (current) use of aspirin: Secondary | ICD-10-CM | POA: Insufficient documentation

## 2014-09-04 DIAGNOSIS — M25559 Pain in unspecified hip: Secondary | ICD-10-CM | POA: Insufficient documentation

## 2014-09-04 DIAGNOSIS — D751 Secondary polycythemia: Secondary | ICD-10-CM | POA: Insufficient documentation

## 2014-09-04 DIAGNOSIS — M25551 Pain in right hip: Principal | ICD-10-CM | POA: Insufficient documentation

## 2014-09-04 LAB — BASIC METABOLIC PANEL
Anion gap: 11 (ref 5–15)
BUN: 11 mg/dL (ref 6–23)
CO2: 21 mmol/L (ref 19–32)
CREATININE: 0.93 mg/dL (ref 0.50–1.35)
Calcium: 8.7 mg/dL (ref 8.4–10.5)
Chloride: 104 mmol/L (ref 96–112)
GFR calc non Af Amer: >90 mL/min (ref >90)
GLUCOSE: 285 mg/dL — AB (ref 70–99)
POTASSIUM: 4.4 mmol/L (ref 3.5–5.1)
Sodium: 136 mmol/L (ref 135–145)

## 2014-09-04 LAB — CBC
HCT: 49 % (ref 39.0–52.0)
Hemoglobin: 14.8 g/dL (ref 13.0–17.0)
MCH: 18.9 pg — AB (ref 26.0–34.0)
MCHC: 30.2 g/dL (ref 30.0–36.0)
MCV: 62.4 fL — ABNORMAL LOW (ref 78.0–100.0)
PLATELETS: 400 10*3/uL (ref 150–400)
RBC: 7.85 MIL/uL — AB (ref 4.22–5.81)
RDW: 17.9 % — ABNORMAL HIGH (ref 11.5–15.5)
WBC: 13.5 10*3/uL — ABNORMAL HIGH (ref 4.0–10.5)

## 2014-09-04 LAB — PROTIME-INR
INR: 1.3 (ref 0.00–1.49)
PROTHROMBIN TIME: 16.4 s — AB (ref 11.6–15.2)

## 2014-09-04 LAB — CK: Total CK: 39 U/L (ref 7–232)

## 2014-09-04 LAB — TROPONIN I

## 2014-09-04 MED ORDER — IOHEXOL 350 MG/ML SOLN
100.0000 mL | Freq: Once | INTRAVENOUS | Status: AC | PRN
  Administered 2014-09-04: 100 mL via INTRAVENOUS

## 2014-09-04 MED ORDER — HYDRALAZINE HCL 20 MG/ML IJ SOLN
5.0000 mg | Freq: Four times a day (QID) | INTRAMUSCULAR | Status: DC | PRN
  Administered 2014-09-04: 5 mg via INTRAVENOUS
  Filled 2014-09-04: qty 1

## 2014-09-04 MED ORDER — ONDANSETRON HCL 4 MG PO TABS: 4.0000 mg | ORAL_TABLET | Freq: Four times a day (QID) | ORAL | Status: DC | PRN

## 2014-09-04 MED ORDER — ACETAMINOPHEN 325 MG PO TABS: 325.0000 mg | ORAL_TABLET | Freq: Four times a day (QID) | ORAL | Status: DC | PRN

## 2014-09-04 MED ORDER — MORPHINE SULFATE 2 MG/ML IJ SOLN
1.0000 mg | INTRAMUSCULAR | Status: DC | PRN
  Administered 2014-09-04 – 2014-09-05 (×6): 1 mg via INTRAVENOUS
  Filled 2014-09-04 (×6): qty 1

## 2014-09-04 MED ORDER — HYDROMORPHONE HCL 1 MG/ML IJ SOLN
1.0000 mg | Freq: Once | INTRAMUSCULAR | Status: AC
  Administered 2014-09-04: 1 mg via INTRAVENOUS
  Filled 2014-09-04: qty 1

## 2014-09-04 MED ORDER — ADULT MULTIVITAMIN W/MINERALS CH
2.0000 | ORAL_TABLET | Freq: Every day | ORAL | Status: DC
  Administered 2014-09-04 – 2014-09-05 (×2): 2 via ORAL
  Filled 2014-09-04 (×2): qty 2

## 2014-09-04 MED ORDER — ASPIRIN 81 MG PO CHEW
81.0000 mg | CHEWABLE_TABLET | Freq: Every day | ORAL | Status: DC
  Administered 2014-09-04 – 2014-09-05 (×2): 81 mg via ORAL
  Filled 2014-09-04 (×3): qty 1

## 2014-09-04 MED ORDER — IOHEXOL 350 MG/ML SOLN: 100.0000 mL | Freq: Once | INTRAVENOUS | Status: DC | PRN

## 2014-09-04 MED ORDER — DIAZEPAM 5 MG PO TABS
5.0000 mg | ORAL_TABLET | Freq: Once | ORAL | Status: AC
  Administered 2014-09-04: 5 mg via ORAL
  Filled 2014-09-04: qty 1

## 2014-09-04 MED ORDER — AMLODIPINE BESYLATE 5 MG PO TABS
5.0000 mg | ORAL_TABLET | Freq: Every day | ORAL | Status: DC
  Administered 2014-09-04 – 2014-09-05 (×2): 5 mg via ORAL
  Filled 2014-09-04 (×2): qty 1

## 2014-09-04 MED ORDER — ONDANSETRON HCL 4 MG/2ML IJ SOLN: 4.0000 mg | Freq: Four times a day (QID) | INTRAMUSCULAR | Status: DC | PRN

## 2014-09-04 MED ORDER — SODIUM CHLORIDE 0.9 % IV BOLUS (SEPSIS)
1000.0000 mL | Freq: Once | INTRAVENOUS | Status: AC
  Administered 2014-09-04: 1000 mL via INTRAVENOUS

## 2014-09-04 MED ORDER — SODIUM CHLORIDE 0.9 % IV SOLN
INTRAVENOUS | Status: AC
  Administered 2014-09-04: 1000 mL via INTRAVENOUS

## 2014-09-04 NOTE — Progress Notes (Addendum)
At around 1745,this nurse was called in to room,patient c/o chest tightness/pain to mid chest,sweaty,no c/o SOB,holding on to mid chest,. CP occurred after consuming his food. Vital signs checked BP up to 161/98,PR108. A around 1750 Dr. Charlies Silvers was notified,orders received,EKG done and paged her with the results,norvacs given per order. Will continue to monitor the patient.- Sandie Ano RN

## 2014-09-04 NOTE — Progress Notes (Signed)
Patient arrived to unit at around 1545,BP was high at 151/99,paged Dr. Charlies Silvers and ordered Hydralazine 5 mg IV every 6 hours PRN fo BP. 140/90. Kept patient comfortable in bed. Will continue to monitor the patient.- Sandie Ano RN

## 2014-09-04 NOTE — Progress Notes (Signed)
Patient denies chest pain at this time,comfortable,patient said he burped. Will endorsed to night nurse.Sandie Ano RN

## 2014-09-04 NOTE — ED Notes (Signed)
Pt reports gradually worsening pain to R thigh, with difficulty standing or walking, constant pain. Denies known injury. Denies back pain. Sts some pain to bilateral groins. Sts pain kind of started in L thigh but is now all in the R.

## 2014-09-04 NOTE — ED Notes (Signed)
Pt transported to DG at present time. Will draw labs and administer medication with pt return.

## 2014-09-04 NOTE — ED Notes (Addendum)
Pt reports right thigh pain for 2 days. Pt denies numbness or tingling. Pt denies injury but reports "stayed in bed for two days because pain so bad with movement." No redness, swelling, or abnormal skin condition noted to right thigh.

## 2014-09-04 NOTE — H&P (Signed)
[facility] History and Physical  Robert Avila RFF:[id number] DOB: 11-12-1980 DOA: 09/04/2014  Referring physician: ER physician PCP: No PCP Per Patient   Chief Complaint: lower extremity pain   HPI:  34 year old male with past medical history of polycythemia, has had admission in October 2015 when he required phlebotomy in hospital for couple of days on daily basis. Patient presented to [facility] ED worsening lower extremity weakness for past 2 days and sudden inability to move his left lower extremity very well. Patient reports he is able to stand up but has a hard time ambulating and has a lot of pain if he attempts to ambulate. Pain is the worst 8 out of 10 in intensity, in right lower extremity more so than left lower extremity, it is sharp and intermittent. Pain was better with analgesia given in ED. No complaints of loss of sensation in lower extremities. No reports of falls. No other complaints such as chest pain, shortness of breath or palpitations. No complaints of abdominal pain, nausea or vomiting. No fevers or chills. No diarrhea or constipation. No urinary complaints.  In ED, patient is hemodynamically stable. X-ray of the pelvis did not reveal acute fracture. X-ray of the lumbar spine shows normal alignment and in no acute bony findings. CT abdomen/pelvis showed no gross evidence of aortic dissection or thrombus within the aorta or branches. 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 Patient was admitted for further evaluation and management of lower extremity weakness.  Assessment & Plan    Principal problem: Lower extremity weakness, right lower extremity more than left lower extremity - Unclear etiology. We will obtain MRI of thoracolumbar spine for further evaluation. - X-ray of the pelvis did not reveal acute fracture. X-ray of the lumbar spine shows normal alignment and in no  acute bony findings. CT abdomen/pelvis showed no gross evidence of aortic dissection or thrombus within the aorta or branches. 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 - PT evaluation order placed, appreciate their recommendations. - Supportive care with analgesia as needed for pain control  Active Problems:  Polycythemia vera  - Patient with recent history of polycythemia vera, admission in October 2015 when he required daily phlebotomy for couple of days. - Patient is on low-dose aspirin for thrombosis prevention - CBC reveals white blood cell count of 13.5, hemoglobin 14.8 and platelets of 400  Leukocytosis  - Likely secondary to myeloproliferative disorder / polycythemia - White blood cell count is 13.5.  Elevated blood pressure  - Started Norvasc 5 mg daily.   DVT prophylaxis:  - SCD's bilaterally, aspirin    Radiological Exams on Admission: Dg Lumbar Spine 2-3 Views 09/04/2014  Normal alignment and no acute bony findings.     Dg Pelvis 1-2 Views 09/04/2014  Negative.     Ct Cta Abd/pel W/cm &/or W/o Cm 09/04/2014   1. No gross evidence of aortic dissection or thrombus within the aorta or branches in suboptimal CTA exam. 2. 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 Portal veins are patent.   Code Status: Full Family Communication: Plan of care discussed with the patient  Disposition Plan: Admit for further evaluation  Leisa Lenz, MD  [facility] Pager [phone]  Review of Systems:  Constitutional: Negative for fever, chills and malaise/fatigue. Negative for diaphoresis.  HENT: Negative for hearing loss, ear pain, nosebleeds, congestion, sore throat, neck pain, tinnitus and ear discharge.   Eyes: Negative for blurred vision, double vision, photophobia, pain, discharge and redness.   Respiratory: Negative for cough, hemoptysis, sputum production, shortness of breath, wheezing and stridor.   Cardiovascular: Negative for chest pain, palpitations, orthopnea, claudication and leg swelling.  Gastrointestinal: Negative for nausea, vomiting and abdominal pain. Negative for heartburn, constipation, blood in stool and melena.  Genitourinary: Negative for dysuria, urgency, frequency, hematuria and flank pain.  Musculoskeletal: Negative for joint pain and falls.  Skin: Negative for itching and rash.  Neurological: per HPI  Endo/Heme/Allergies: Negative for environmental allergies and polydipsia. Does not bruise/bleed easily.  Psychiatric/Behavioral: Negative for suicidal ideas. The patient is not nervous/anxious.      Past Medical History  Diagnosis Date  . Hypertension   . Polycythemia    Past Surgical History  Procedure Laterality Date  . Dental surgery     Social History:  reports that he has never smoked. He has never used smokeless tobacco. He reports that he drinks alcohol. He reports that he does not use illicit drugs.  Allergies  Allergen Reactions  . Adhesive [Tape] Rash    Family History:  Family History  Problem Relation Age of Onset  . Diabetes, seizures  Father      Prior to Admission medications   Medication Sig Start Date End Date Taking? Authorizing Provider  acetaminophen (TYLENOL) 500 MG tablet Take 2,000 mg by mouth every 6 (six) hours as needed (For pain.).   Yes Historical Provider, MD  aspirin 81 MG chewable tablet Chew 1 tablet (81 mg total) by mouth daily. 06/01/14  Yes Nishant Dhungel, MD  ibuprofen (ADVIL,MOTRIN) 200 MG tablet Take 800 mg by mouth every 6 (six) hours as needed (For pain.).    Yes Historical Provider, MD  Menthol-Methyl Salicylate (MUSCLE RUB) 10-15 % CREA Apply 1 application topically daily as needed for muscle pain (Applies to right leg pain.).   Yes Historical Provider, MD  Multiple Vitamin (MULTIVITAMIN WITH MINERALS)  TABS tablet Take 2 tablets by mouth daily.   Yes Historical Provider, MD  naproxen (NAPROSYN) 500 MG tablet Take 1 tablet (500 mg total) by mouth 2 (two) times daily with a meal. Patient not taking: Reported on 09/04/2014 06/01/14   Louellen Molder, MD   Physical Exam: Filed Vitals:   09/04/14 0806 09/04/14 0842 09/04/14 1032 09/04/14 1213  BP: 158/95 148/88 147/88 155/97  Pulse: 110 99 106 104  Temp: 99.1 F (37.3 C)     TempSrc: Oral     Resp: 18 18 18 18   SpO2: 98% 96% 94% 95%    Physical Exam  Constitutional: Appears well-developed and well-nourished. No distress.  HENT: Normocephalic. No tonsillar erythema or exudates Eyes: Conjunctivae and EOM are normal. PERRLA, no scleral icterus.  Neck: Normal ROM. Neck supple. No JVD. No tracheal deviation. No thyromegaly.  CVS: RRR, S1/S2 +, no murmurs, no gallops, no carotid bruit.  Pulmonary: Effort and breath sounds normal, no stridor, rhonchi, wheezes, rales.  Abdominal: Soft. BS +,  no distension, tenderness, rebound or guarding.  Musculoskeletal: Normal range of motion. No edema and no tenderness.  Lymphadenopathy: No lymphadenopathy noted, cervical, inguinal. Neuro: Alert. Right lower extremity able to lift minimally against gravity, normal strength LLE, sensation intact  Skin: Skin is warm and dry. No rash noted.  No erythema. No pallor.  Psychiatric: Normal mood and affect. Behavior, judgment, thought content normal.   Labs on  Admission:  Basic Metabolic Panel:  Recent Labs Lab 09/04/14 0829  NA 136  K 4.4  CL 104  CO2 21  GLUCOSE 285*  BUN 11  CREATININE 0.93  CALCIUM 8.7   Liver Function Tests: No results for input(s): AST, ALT, ALKPHOS, BILITOT, PROT, ALBUMIN in the last 168 hours. No results for input(s): LIPASE, AMYLASE in the last 168 hours. No results for input(s): AMMONIA in the last 168 hours. CBC:  Recent Labs Lab 09/04/14 0829  WBC 13.5*  HGB 14.8  HCT 49.0  MCV 62.4*  PLT 400   Cardiac  Enzymes:  Recent Labs Lab 09/04/14 0829  CKTOTAL 39   BNP: Invalid input(s): POCBNP CBG: No results for input(s): GLUCAP in the last 168 hours.  If 7PM-7AM, please contact night-coverage [email or web address] Password [email or web address] 09/04/2014, 1:25 PM

## 2014-09-04 NOTE — ED Notes (Signed)
Pt transported to CT ?

## 2014-09-04 NOTE — Progress Notes (Signed)
  CARE MANAGEMENT ED NOTE 09/04/2014  Patient:  Robert Avila,Robert Avila   Account Number:  [id number]  Date Initiated:  09/04/2014  Documentation initiated by:  Jackelyn Poling  Subjective/Objective Assessment:   34 yr old self pay [facility] pt gradually worsening pain to R thigh, with difficulty standing or walking, constant pain. Denies known injury. Denies back pain. Sts some pain to bilateral groins.     Subjective/Objective Assessment Detail:   no pcp as confirmed by pt He states she did not f/u with resources and recommendations offered  Confirms still followed by Dr Alvy Bimler for "blood disorder" Next appt in April 2016  05/25/14 Unit Cm  Pt states his wife was working on further [employer] paper work after they sent in forms to his case worker in 2015 States they were trying to get medicaid "for the whole family but did not get it"     Action/Plan:   Noted CM consult for medication assistance No pcp Spoke with pt about self pay resources see notes below Discussed medication assistance will be determined prior to discharge pending the medication list for d/c Pt voiced understanding   Action/Plan Detail:   see notes below Strongly encouraged f/u with [facility] & [place] to get pcp, pharmacy, specialist services for self pay + possible assist with medicaid application   Anticipated DC Date:  09/05/2014     Status Recommendation to Physician:   Result of Recommendation:    Other ED Services  Consult [facility] [place]  Other  Outpatient Services - Pt will follow up  PCP issues  Medication Assistance    Choice offered to / List presented to:            Status of service:  Completed, signed off  ED Comments:   ED Comments Detail:  CM spoke with pt who confirms self pay [facility] resident with no pcp. CM discussed and provided written information for self pay pcps, importance of pcp for f/u care, www.needymeds.org, www.goodrx.com, discounted pharmacies and other  [employer] such as [employer], affordable care act,  financial assistance, DSS and  health department  Reviewed resources for [employer] self pay pcps like [facility] Blossom, family medicine at [address], [facility] family practice, general medical clinics, [facility] urgent care plus others, medication resources, [facility] out patient pharmacies and housing Pt voiced understanding and appreciation of resources provided  Provided [facility] contact information Agreed to referral Completed referral States the address and # in EPIC is correct

## 2014-09-04 NOTE — ED Notes (Signed)
Pt reports "my wife woke me up when she got back here and it [right leg] hurts again." [place] aware and reports on way to talk to pt at present time.

## 2014-09-04 NOTE — ED Notes (Signed)
Pt transported to MRI; MRI tech report exam last an hour. Wife, Earnest Bailey, contact information [phone].

## 2014-09-04 NOTE — Progress Notes (Signed)
Patient refused med at this time,stated " I'm ok right now,I dont move my right leg. Comfortable,eating.- Sandie Ano RN

## 2014-09-04 NOTE — ED Provider Notes (Signed)
CSN: [id number]     Arrival date & time 09/04/14  0756 History   First MD Initiated Contact with Patient 09/04/14 [phone]     Chief Complaint  Patient presents with  . Leg Pain     (Consider location/radiation/quality/duration/timing/severity/associated sxs/prior Treatment) HPI Comments: Began having left sided hip pain a few days ago, then began to include his R hip. No trauma.  Patient is a 34 y.o. male presenting with hip pain. The history is provided by the patient.  Hip Pain This is a new problem. The current episode started more than 2 days ago. The problem occurs constantly. The problem has been gradually worsening. Pertinent negatives include no chest pain, no abdominal pain and no shortness of breath. The symptoms are aggravated by bending (moving his hip). Nothing relieves the symptoms. He has tried nothing for the symptoms.    Past Medical History  Diagnosis Date  . Hypertension   . Polycythemia    Past Surgical History  Procedure Laterality Date  . Dental surgery     Family History  Problem Relation Age of Onset  . Diabetes Father    History  Substance Use Topics  . Smoking status: Never Smoker   . Smokeless tobacco: Never Used  . Alcohol Use: Yes     Comment: occ    Review of Systems  Constitutional: Negative for fever.  Respiratory: Negative for cough and shortness of breath.   Cardiovascular: Negative for chest pain.  Gastrointestinal: Negative for abdominal pain.  Genitourinary: Negative for scrotal swelling, difficulty urinating and testicular pain.  All other systems reviewed and are negative.     Allergies  Review of patient's allergies indicates no known allergies.  Home Medications   Prior to Admission medications   Medication Sig Start Date End Date Taking? Authorizing Provider  aspirin 81 MG chewable tablet Chew 1 tablet (81 mg total) by mouth daily. 06/01/14   Nishant Dhungel, MD  ibuprofen (ADVIL,MOTRIN) 200 MG tablet Take 200 mg by mouth  every 6 (six) hours as needed.    Historical Provider, MD  naproxen (NAPROSYN) 500 MG tablet Take 1 tablet (500 mg total) by mouth 2 (two) times daily with a meal. 06/01/14   Nishant Dhungel, MD   BP 158/95 mmHg  Pulse 110  Temp(Src) 99.1 F (37.3 C) (Oral)  Resp 18  SpO2 98% Physical Exam  Constitutional: He is oriented to person, place, and time. He appears well-developed and well-nourished. No distress.  HENT:  Head: Normocephalic and atraumatic.  Mouth/Throat: No oropharyngeal exudate.  Eyes: EOM are normal. Pupils are equal, round, and reactive to light.  Neck: Normal range of motion. Neck supple.  Cardiovascular: Normal rate and regular rhythm.  Exam reveals no friction rub.   No murmur heard. Pulmonary/Chest: Effort normal and breath sounds normal. No respiratory distress. He has no wheezes. He has no rales.  Abdominal: Soft. He exhibits no distension. There is no tenderness. There is no rebound.  Genitourinary: Testes normal and penis normal.  Musculoskeletal: Normal range of motion. He exhibits no edema.  Neurological: He is alert and oriented to person, place, and time.  Skin: No rash noted. He is not diaphoretic.  Nursing note and vitals reviewed.   ED Course  Procedures (including critical care time) Labs Review Labs Reviewed  CBC  BASIC METABOLIC PANEL  PROTIME-INR  CK    Imaging Review Dg Lumbar Spine 2-3 Views  09/04/2014   CLINICAL DATA:  One-week history of severe low back pain, bilateral hip  pain and right leg pain.  EXAM: LUMBAR SPINE - 2-3 VIEW  COMPARISON:  None.  FINDINGS: Normal alignment of the lumbar vertebral bodies. The disc spaces and vertebral bodies are maintained. Small marginal osteophytes are noted at L2-3. More advanced degenerative changes are noted in the lower thoracic spine. The facets are normally aligned. No pars defects. No acute bony findings. The visualized bony pelvis is intact.  IMPRESSION: Normal alignment and no acute bony  findings.   Electronically Signed   By: Kalman Jewels M.D.   On: 09/04/2014 10:12   Dg Pelvis 1-2 Views  09/04/2014   CLINICAL DATA:  Severe bilateral hip pain for 3 days  EXAM: PELVIS - 1-2 VIEW  COMPARISON:  CT scan 05/24/2014  FINDINGS: There is no evidence of pelvic fracture or diastasis. No pelvic bone lesions are seen.  IMPRESSION: Negative.   Electronically Signed   By: Lahoma Crocker M.D.   On: 09/04/2014 08:42   Mr Thoracic Spine Wo Contrast  09/04/2014   CLINICAL DATA:  Right back pain for 2 days.  No known injury.  EXAM: MRI THORACIC AND LUMBAR SPINE WITHOUT CONTRAST  TECHNIQUE: Multiplanar and multiecho pulse sequences of the thoracic and lumbar spine were obtained without intravenous contrast.  COMPARISON:  CT abdomen and pelvis 08/26/2016.  FINDINGS: MR THORACIC SPINE FINDINGS  There is diffusely decreased marrow signal in all image bones on T1 weighted imaging. Review of the patient's chart demonstrates a history of myeloproliferative disorder which may account for this finding. There is convex right thoracic scoliosis. Vertebral body height is maintained. The thoracic cord demonstrates normal signal.  Disc height is maintained at all levels. No disc bulge or protrusion is identified. Thoracic cord is deflected to the left from the T2-3 to the T5-6 level due to scoliosis. The facet joints are unremarkable.  In the superficial subcutaneous tissues of the back at the level of T5, there is a T2 hyperintense, T1 hypointense lesion measuring 2.0 cm craniocaudal by 1.8 cm transverse by 1.6 cm AP.  MR LUMBAR SPINE FINDINGS  As in the thoracic spine, marrow signal is uniformly and diffusely decreased on T1 weighted imaging. There is convex right scoliosis with the apex at approximately L3. The conus medullaris is normal in signal and position. Disc height and hydration are maintained at all levels. The central canal and foramina are widely patent at all levels. Imaged paraspinous structures are  unremarkable.  IMPRESSION: MR THORACIC SPINE IMPRESSION  Abnormal marrow signal in all visualized bones compatible with marrow proliferative process. Review of the patient's chart indicates he has been evaluated per Hematology for myeloproliferative disorder in October of 2015.  No finding to explain the patient's extremity symptoms. The central canal and foramina are widely patent at all levels.  Scoliosis.  Lesion in the subcutaneous tissues of the posterior upper back is likely a sebaceous cyst.  MR LUMBAR SPINE IMPRESSION  Abnormal marrow signal in all visualized bones as in the thoracic spine described above.  No finding to explain the patient's extremity symptoms. The central canal and foramina widely patent at all levels.  Scoliosis.   Electronically Signed   By: Inge Rise M.D.   On: 09/04/2014 15:04   Mr Lumbar Spine Wo Contrast  09/04/2014   CLINICAL DATA:  Right back pain for 2 days.  No known injury.  EXAM: MRI THORACIC AND LUMBAR SPINE WITHOUT CONTRAST  TECHNIQUE: Multiplanar and multiecho pulse sequences of the thoracic and lumbar spine were obtained without intravenous contrast.  COMPARISON:  CT abdomen and pelvis 08/26/2016.  FINDINGS: MR THORACIC SPINE FINDINGS  There is diffusely decreased marrow signal in all image bones on T1 weighted imaging. Review of the patient's chart demonstrates a history of myeloproliferative disorder which may account for this finding. There is convex right thoracic scoliosis. Vertebral body height is maintained. The thoracic cord demonstrates normal signal.  Disc height is maintained at all levels. No disc bulge or protrusion is identified. Thoracic cord is deflected to the left from the T2-3 to the T5-6 level due to scoliosis. The facet joints are unremarkable.  In the superficial subcutaneous tissues of the back at the level of T5, there is a T2 hyperintense, T1 hypointense lesion measuring 2.0 cm craniocaudal by 1.8 cm transverse by 1.6 cm AP.  MR LUMBAR  SPINE FINDINGS  As in the thoracic spine, marrow signal is uniformly and diffusely decreased on T1 weighted imaging. There is convex right scoliosis with the apex at approximately L3. The conus medullaris is normal in signal and position. Disc height and hydration are maintained at all levels. The central canal and foramina are widely patent at all levels. Imaged paraspinous structures are unremarkable.  IMPRESSION: MR THORACIC SPINE IMPRESSION  Abnormal marrow signal in all visualized bones compatible with marrow proliferative process. Review of the patient's chart indicates he has been evaluated per Hematology for myeloproliferative disorder in October of 2015.  No finding to explain the patient's extremity symptoms. The central canal and foramina are widely patent at all levels.  Scoliosis.  Lesion in the subcutaneous tissues of the posterior upper back is likely a sebaceous cyst.  MR LUMBAR SPINE IMPRESSION  Abnormal marrow signal in all visualized bones as in the thoracic spine described above.  No finding to explain the patient's extremity symptoms. The central canal and foramina widely patent at all levels.  Scoliosis.   Electronically Signed   By: Inge Rise M.D.   On: 09/04/2014 15:04   Ct Cta Abd/pel W/cm &/or W/o Cm  09/04/2014   CLINICAL DATA:  Bilateral leg pain. History of polycythemia vera. Concern for dissection of the abdominal aorta or in the thrombosis.  EXAM: CTA ABDOMEN AND PELVIS wITHOUT AND WITH CONTRAST  TECHNIQUE: Multidetector CT imaging of the abdomen and pelvis was performed using the standard protocol during bolus administration of intravenous contrast. Multiplanar reconstructed images and MIPs were obtained and reviewed to evaluate the vascular anatomy.  CONTRAST:  125mL OMNIPAQUE IOHEXOL 350 MG/ML SOLN, 126mL OMNIPAQUE IOHEXOL 350 MG/ML SOLN  COMPARISON:  None.  FINDINGS: Initial scan had poor concentration the bolus in the abdominal aorta. Scan was repeated with little  improvement of the IV contrast bolus within the abdominal aorta. With this limitation, there are no evidence of dissection of the abdominal aorta. There is no aneurysm of abdominal aorta. Branch vessels are grossly patent. Iliac arteries are grossly normal. No gross evidence of thrombus.  The kidneys enhance symmetrically.  Hepatic artery is patent.  The spleen is enlarged measuring 20 cm in craniocaudad dimension. The spleen vein is also enlarged. Portal veins are patent.  The lung bases are clear. No pulmonary edema or interstitial edema. Heart is grossly normal.  Liver, gallbladder, pancreas, adrenal glands, kidneys are normal.  The stomach, small bowel, appendix, cecum normal. Colon and rectosigmoid colon are normal.  The prostate gland and bladder normal.  Review of the MIP images confirms the above findings.  IMPRESSION: 1. No gross evidence of aortic dissection or thrombus within the aorta or branches  in suboptimal CTA exam. 2. 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 Portal veins are patent. Findings conveyed Penelope Galas on 09/04/2014  at12:34.   Electronically Signed   By: Suzy Bouchard M.D.   On: 09/04/2014 12:38     EKG Interpretation None      MDM   Final diagnoses:  Hip pain  Lower back pain    79M presents with bilateral thigh pain. Began his left thigh, now is also having in the right thigh. No injury. No history of this happening previously. Denies fever, abdominal pain, headaches, vision changes, numbness, tingling. No difficulty with bowel movements, urinary retention, urinary incontinence. No saddle anesthesias. Patient does have history of polycythemia vera and has had phlebotomy, last one a month ago. He takes a baby aspirin a day but no other medicines for this. Here he is afebrile, mildly tachycardic likely due to pain. On exam he has right lateral and left lateral 5 tenderness, right  groin tenderness to palpation. He has bounding pulses in his feet and normal pulses in his bilateral groins. No history of blood clots with his polycythemia. He has a a lot of pain with moving his right hip and cannot tolerate me internally or externally rotating it. He requires assistance to flex his right hip. He has bounding pulses in his feet, doubt large clot, he has good femoral pulses bilaterally, doubt large clot.  Doubt arterial dissection. Could be possible lumbar radiculopathy or AVN of his hips. Will start with pain control and xray of his pelvis. This could all be related to a musculoskeletal pain. He has a normal GU exam. No signs of cord compression on exam.  Admitted.  Evelina Bucy, MD 09/04/14 1626

## 2014-09-04 NOTE — ED Notes (Signed)
Pt return from [facility] with complaint of right thigh pain with movement.

## 2014-09-05 DIAGNOSIS — I10 Essential (primary) hypertension: Secondary | ICD-10-CM

## 2014-09-05 DIAGNOSIS — M79609 Pain in unspecified limb: Secondary | ICD-10-CM

## 2014-09-05 LAB — URINALYSIS, ROUTINE W REFLEX MICROSCOPIC
BILIRUBIN URINE: NEGATIVE
Glucose, UA: 250 mg/dL — AB
Ketones, ur: 15 mg/dL — AB
Leukocytes, UA: NEGATIVE
NITRITE: NEGATIVE
Specific Gravity, Urine: 1.029 (ref 1.005–1.030)
UROBILINOGEN UA: 0.2 mg/dL (ref 0.0–1.0)
pH: 6 (ref 5.0–8.0)

## 2014-09-05 LAB — COMPREHENSIVE METABOLIC PANEL
ALBUMIN: 3.4 g/dL — AB (ref 3.5–5.2)
ALK PHOS: 141 U/L — AB (ref 39–117)
ALT: 19 U/L (ref 0–53)
ANION GAP: 10 (ref 5–15)
AST: 17 U/L (ref 0–37)
BUN: 9 mg/dL (ref 6–23)
CALCIUM: 8.7 mg/dL (ref 8.4–10.5)
CO2: 24 mmol/L (ref 19–32)
Chloride: 103 mmol/L (ref 96–112)
Creatinine, Ser: 0.79 mg/dL (ref 0.50–1.35)
GFR calc non Af Amer: >90 mL/min (ref >90)
Glucose, Bld: 175 mg/dL — ABNORMAL HIGH (ref 70–99)
POTASSIUM: 3.8 mmol/L (ref 3.5–5.1)
Sodium: 137 mmol/L (ref 135–145)
Total Bilirubin: 1 mg/dL (ref 0.3–1.2)
Total Protein: 7.1 g/dL (ref 6.0–8.3)

## 2014-09-05 LAB — CBC
HCT: 48.1 % (ref 39.0–52.0)
HEMOGLOBIN: 14.7 g/dL (ref 13.0–17.0)
MCH: 18.9 pg — ABNORMAL LOW (ref 26.0–34.0)
MCHC: 30.6 g/dL (ref 30.0–36.0)
MCV: 62 fL — ABNORMAL LOW (ref 78.0–100.0)
Platelets: 429 10*3/uL — ABNORMAL HIGH (ref 150–400)
RBC: 7.76 MIL/uL — AB (ref 4.22–5.81)
RDW: 18.1 % — ABNORMAL HIGH (ref 11.5–15.5)
WBC: 11.3 10*3/uL — ABNORMAL HIGH (ref 4.0–10.5)

## 2014-09-05 LAB — TROPONIN I: Troponin I: <0.03 ng/mL (ref <0.031)

## 2014-09-05 LAB — URINE MICROSCOPIC-ADD ON

## 2014-09-05 LAB — GLUCOSE, CAPILLARY: Glucose-Capillary: 164 mg/dL — ABNORMAL HIGH (ref 70–99)

## 2014-09-05 MED ORDER — AMLODIPINE BESYLATE 5 MG PO TABS: 5.0000 mg | ORAL_TABLET | Freq: Every day | ORAL | Status: DC

## 2014-09-05 MED ORDER — CYCLOBENZAPRINE HCL 10 MG PO TABS
10.0000 mg | ORAL_TABLET | Freq: Three times a day (TID) | ORAL | Status: DC
  Administered 2014-09-05: 10 mg via ORAL
  Filled 2014-09-05: qty 1

## 2014-09-05 MED ORDER — CYCLOBENZAPRINE HCL 10 MG PO TABS: 10.0000 mg | ORAL_TABLET | Freq: Three times a day (TID) | ORAL | Status: DC

## 2014-09-05 NOTE — Progress Notes (Signed)
Patient discharged home, all discharge medications and instructions reviewed and questions answered. Patient to be assisted to vehicle by wheelchair.  

## 2014-09-05 NOTE — Progress Notes (Signed)
Pt BP 140/95. Explained to pt that he would need to take hydralazine to lower BP per MD order. Pt declined  Hydralazine at this time. Pt able to receive morphine for pain in 20 minutes. Will continue to monitor. Noreene Larsson RN, BSN

## 2014-09-05 NOTE — Progress Notes (Signed)
UR completed 

## 2014-09-05 NOTE — Progress Notes (Signed)
VASCULAR LAB PRELIMINARY  PRELIMINARY  PRELIMINARY  PRELIMINARY  Bilateral lower extremity venous duplex completed.    Preliminary report:  Bilateral:  No evidence of DVT, superficial thrombosis, or Baker's Cyst.   Robert Avila, RVS 09/05/2014, 9:44 AM

## 2014-09-05 NOTE — Evaluation (Addendum)
Physical Therapy Evaluation Patient Details Name: Robert Avila MRN: [id number] DOB: 1981/01/24 Today's Date: 09/05/2014   History of Present Illness  34 year old male with past medical history of polycythemia, has had admission in October 2015 when he required phlebotomy in hospital for couple of days on daily basis. Patient presented to [facility] ED worsening lower extremity weakness for past 2 days and sudden inability to move his left lower extremity very well.   Clinical Impression  *Pt ambulated 58' with RW independently, distance limited by R thigh pain and fatigue. Pt reports he can independently get up the 5 steps to enter his home using handrails. From PT standpoint he can DC home. He will borrow a walker from his father. **    Follow Up Recommendations No PT follow up    Equipment Recommendations  None recommended by PT    Recommendations for Other Services       Precautions / Restrictions Precautions Precautions: Fall Restrictions Weight Bearing Restrictions: No      Mobility  Bed Mobility Overal bed mobility: Modified Independent             General bed mobility comments: used rail, HOB up 40*  Transfers Overall transfer level: Modified independent Equipment used: Rolling walker (2 wheeled)             General transfer comment: from elevated bed  Ambulation/Gait Ambulation/Gait assistance: Modified independent (Device/Increase time) Ambulation Distance (Feet): 75 Feet Assistive device: Rolling walker (2 wheeled) Gait Pattern/deviations: Step-to pattern;Decreased step length - right   Gait velocity interpretation: Below normal speed for age/gender General Gait Details: increased time, RLE externally rotated, heavy use of BUEs to unweight RLE, 4/10 pain R thigh with walking  Stairs            Wheelchair Mobility    Modified Rankin (Stroke Patients Only)       Balance Overall balance assessment: Modified Independent                                           Pertinent Vitals/Pain Pain Assessment: 0-10 Pain Score: 4  Pain Location: R anterior thigh Pain Descriptors / Indicators: Sore Pain Intervention(s): Limited activity within patient's tolerance;Monitored during session (pt declined premedication)    Home Living Family/patient expects to be discharged to:: Private residence Living Arrangements: Spouse/significant other Available Help at Discharge: Family;Available PRN/intermittently   Home Access: Stairs to enter Entrance Stairs-Rails: Right;Left;Can reach both Entrance Stairs-Number of Steps: 5 Home Layout: One level Home Equipment: Walker - 4 wheels Additional Comments: will borrow rollator from his father    Prior Function Level of Independence: Independent         Comments: works at tobacco [employer]     Journalist, newspaper        Extremity/Trunk Assessment               Lower Extremity Assessment: RLE deficits/detail RLE Deficits / Details: R knee extension -3/5, ankle WNL, hip flexion +2/5 -limited by pain; sensation intact to light touch    Cervical / Trunk Assessment: Normal  Communication   Communication: No difficulties  Cognition Arousal/Alertness: Awake/alert Behavior During Therapy: WFL for tasks assessed/performed Overall Cognitive Status: Within Functional Limits for tasks assessed                      General Comments      Exercises  Assessment/Plan    PT Assessment Patent does not need any further PT services  PT Diagnosis Difficulty walking;Acute pain   PT Problem List    PT Treatment Interventions     PT Goals (Current goals can be found in the Care Plan section) Acute Rehab PT Goals Patient Stated Goal: to walk without pain PT Goal Formulation: All assessment and education complete, DC therapy    Frequency     Barriers to discharge        Co-evaluation               End of Session Equipment Utilized During Treatment: Gait  belt Activity Tolerance: Patient limited by fatigue;Patient limited by pain Patient left: in chair;with call bell/phone within reach Nurse Communication: Mobility status         Time: 1350-1415 PT Time Calculation (min) (ACUTE ONLY): 25 min   Charges:   PT Evaluation $Initial PT Evaluation Tier I: 1 Procedure PT Treatments $Gait Training: 8-22 mins   PT G Codes:  PT G-Codes **NOT FOR INPATIENT CLASS** Functional Assessment Tool Used clinical judgement clinical judgement at 1424 on 09/05/14 by Lucile Crater, PT Functional Limitation Mobility: Walking and moving around Mobility: Walking and moving around at 1424 on 09/05/14 by Lucile Crater, PT Mobility: Walking and Moving Around Current Status [phone]) At least 20 percent but less than 40 percent impaired, limited or restricted CJ at 1424 on 09/05/14 by Lucile Crater, PT Mobility: Walking and Moving Around Goal Status [phone]) At least 20 percent but less than 40 percent impaired, limited or restricted CJ at 1424 on 09/05/14 by Lucile Crater, PT Mobility: Walking and Moving Around Discharge Status [phone]) At least 20 percent but less than 40 percent impaired, limited or restricted CJ at 1424 on 09/05/14 by Lucile Crater, PT       Blondell Reveal Kistler 09/05/2014, 2:25 PM [phone]

## 2014-09-05 NOTE — Discharge Summary (Signed)
Physician Discharge Summary  Emre Stock OHY:[id number] DOB: [birth year] DOA: 09/04/2014  PCP: No PCP Per Patient  Admit date: 09/04/2014 Discharge date: 09/05/2014  Time spent: *25 minutes  Recommendations for Outpatient Follow-up:  1. Follow up PCP in 2 weeks  Discharge Diagnoses:  Active Problems:   Lower back pain   Leg pain   Discharge Condition: Stable  Diet recommendation: *Regular diet  Filed Weights   09/05/14 0520  Weight: 122.154 kg (269 lb 4.8 oz)    History of present illness:  34 year old male with history of polycythemia, who came to the hospital with pain in the right lower extremity for past 2 days with difficulty walking due to the pain. Patient says that the pain started in the left thigh which actually improved on Wednesday but the pain on the right thigh has become worse. In the ED patient underwent CT angiogram of the abdomen and pelvis which showed no evidence of aortic dissection or thrombus within the aorta or branches. X-ray of the pelvis did not show acute fracture of the lumbar spine showed normal alignment. Patient was admitted for further evaluation  Hospital Course:   Right thigh pain- ? Muscular,  patient underwent MRI of the lumbar and thoracic spine which did not show any acute abnormality to explain the patient's symptoms of right thigh pain. Patient was started on Flexeril muscle relaxant, and actually improved with Flexeril. Patient was able to walk with a walker. Physical therapy evaluated the patient and recommended that patient can go home with walker. Patient says that his father has a walker and he will get a walker from him I will discharge the patient home on Flexeril 10 mg by mouth 3 times a day for next 10 days. Continue to use ibuprofen/Tylenol when necessary for pain.  Hypertension- patient was found to have elevated blood pressure, started on amlodipine 5 mg . Will discharge the patient on amlodipine 5 mg by mouth  daily.  Procedures:  *None  Consultations:  None  Discharge Exam: Filed Vitals:   09/05/14 0651  BP: 140/95  Pulse:   Temp:   Resp:     General: Appears in no acute distress Cardiovascular: S1-S2 regular Respiratory: Clear bilaterally Neuro- CN 2-12 intact, no focal deficit Ext - Mild tenderness to palpation of the medial aspect of thigh, no testicle tenderness  Discharge Instructions   Discharge Instructions    Diet - low sodium heart healthy    Complete by:  As directed      Increase activity slowly    Complete by:  As directed           Current Discharge Medication List    START taking these medications   Details  amLODipine (NORVASC) 5 MG tablet Take 1 tablet (5 mg total) by mouth daily. Qty: 30 tablet, Refills: 2    cyclobenzaprine (FLEXERIL) 10 MG tablet Take 1 tablet (10 mg total) by mouth 3 (three) times daily. Qty: 30 tablet, Refills: 0      CONTINUE these medications which have NOT CHANGED   Details  acetaminophen (TYLENOL) 500 MG tablet Take 2,000 mg by mouth every 6 (six) hours as needed (For pain.).    aspirin 81 MG chewable tablet Chew 1 tablet (81 mg total) by mouth daily. Qty: 30 tablet, Refills: 3    ibuprofen (ADVIL,MOTRIN) 200 MG tablet Take 800 mg by mouth every 6 (six) hours as needed (For pain.).     Menthol-Methyl Salicylate (MUSCLE RUB) 10-15 % CREA Apply 1 application  topically daily as needed for muscle pain (Applies to right leg pain.).    Multiple Vitamin (MULTIVITAMIN WITH MINERALS) TABS tablet Take 2 tablets by mouth daily.      STOP taking these medications     naproxen (NAPROSYN) 500 MG tablet        Allergies  Allergen Reactions  . Adhesive [Tape] Rash      The results of significant diagnostics from this hospitalization (including imaging, microbiology, ancillary and laboratory) are listed below for reference.    Significant Diagnostic Studies: Dg Lumbar Spine 2-3 Views  09/04/2014   CLINICAL DATA:   One-week history of severe low back pain, bilateral hip pain and right leg pain.  EXAM: LUMBAR SPINE - 2-3 VIEW  COMPARISON:  None.  FINDINGS: Normal alignment of the lumbar vertebral bodies. The disc spaces and vertebral bodies are maintained. Small marginal osteophytes are noted at L2-3. More advanced degenerative changes are noted in the lower thoracic spine. The facets are normally aligned. No pars defects. No acute bony findings. The visualized bony pelvis is intact.  IMPRESSION: Normal alignment and no acute bony findings.   Electronically Signed   By: Kalman Jewels M.D.   On: 09/04/2014 10:12   Dg Pelvis 1-2 Views  09/04/2014   CLINICAL DATA:  Severe bilateral hip pain for 3 days  EXAM: PELVIS - 1-2 VIEW  COMPARISON:  CT scan 05/24/2014  FINDINGS: There is no evidence of pelvic fracture or diastasis. No pelvic bone lesions are seen.  IMPRESSION: Negative.   Electronically Signed   By: Lahoma Crocker M.D.   On: 09/04/2014 08:42   Mr Thoracic Spine Wo Contrast  09/04/2014   CLINICAL DATA:  Right back pain for 2 days.  No known injury.  EXAM: MRI THORACIC AND LUMBAR SPINE WITHOUT CONTRAST  TECHNIQUE: Multiplanar and multiecho pulse sequences of the thoracic and lumbar spine were obtained without intravenous contrast.  COMPARISON:  CT abdomen and pelvis 08/26/2016.  FINDINGS: MR THORACIC SPINE FINDINGS  There is diffusely decreased marrow signal in all image bones on T1 weighted imaging. Review of the patient's chart demonstrates a history of myeloproliferative disorder which may account for this finding. There is convex right thoracic scoliosis. Vertebral body height is maintained. The thoracic cord demonstrates normal signal.  Disc height is maintained at all levels. No disc bulge or protrusion is identified. Thoracic cord is deflected to the left from the T2-3 to the T5-6 level due to scoliosis. The facet joints are unremarkable.  In the superficial subcutaneous tissues of the back at the level of T5,  there is a T2 hyperintense, T1 hypointense lesion measuring 2.0 cm craniocaudal by 1.8 cm transverse by 1.6 cm AP.  MR LUMBAR SPINE FINDINGS  As in the thoracic spine, marrow signal is uniformly and diffusely decreased on T1 weighted imaging. There is convex right scoliosis with the apex at approximately L3. The conus medullaris is normal in signal and position. Disc height and hydration are maintained at all levels. The central canal and foramina are widely patent at all levels. Imaged paraspinous structures are unremarkable.  IMPRESSION: MR THORACIC SPINE IMPRESSION  Abnormal marrow signal in all visualized bones compatible with marrow proliferative process. Review of the patient's chart indicates he has been evaluated per Hematology for myeloproliferative disorder in October of 2015.  No finding to explain the patient's extremity symptoms. The central canal and foramina are widely patent at all levels.  Scoliosis.  Lesion in the subcutaneous tissues of the posterior upper back  is likely a sebaceous cyst.  MR LUMBAR SPINE IMPRESSION  Abnormal marrow signal in all visualized bones as in the thoracic spine described above.  No finding to explain the patient's extremity symptoms. The central canal and foramina widely patent at all levels.  Scoliosis.   Electronically Signed   By: Inge Rise M.D.   On: 09/04/2014 15:04   Mr Lumbar Spine Wo Contrast  09/04/2014   CLINICAL DATA:  Right back pain for 2 days.  No known injury.  EXAM: MRI THORACIC AND LUMBAR SPINE WITHOUT CONTRAST  TECHNIQUE: Multiplanar and multiecho pulse sequences of the thoracic and lumbar spine were obtained without intravenous contrast.  COMPARISON:  CT abdomen and pelvis 08/26/2016.  FINDINGS: MR THORACIC SPINE FINDINGS  There is diffusely decreased marrow signal in all image bones on T1 weighted imaging. Review of the patient's chart demonstrates a history of myeloproliferative disorder which may account for this finding. There is convex  right thoracic scoliosis. Vertebral body height is maintained. The thoracic cord demonstrates normal signal.  Disc height is maintained at all levels. No disc bulge or protrusion is identified. Thoracic cord is deflected to the left from the T2-3 to the T5-6 level due to scoliosis. The facet joints are unremarkable.  In the superficial subcutaneous tissues of the back at the level of T5, there is a T2 hyperintense, T1 hypointense lesion measuring 2.0 cm craniocaudal by 1.8 cm transverse by 1.6 cm AP.  MR LUMBAR SPINE FINDINGS  As in the thoracic spine, marrow signal is uniformly and diffusely decreased on T1 weighted imaging. There is convex right scoliosis with the apex at approximately L3. The conus medullaris is normal in signal and position. Disc height and hydration are maintained at all levels. The central canal and foramina are widely patent at all levels. Imaged paraspinous structures are unremarkable.  IMPRESSION: MR THORACIC SPINE IMPRESSION  Abnormal marrow signal in all visualized bones compatible with marrow proliferative process. Review of the patient's chart indicates he has been evaluated per Hematology for myeloproliferative disorder in October of 2015.  No finding to explain the patient's extremity symptoms. The central canal and foramina are widely patent at all levels.  Scoliosis.  Lesion in the subcutaneous tissues of the posterior upper back is likely a sebaceous cyst.  MR LUMBAR SPINE IMPRESSION  Abnormal marrow signal in all visualized bones as in the thoracic spine described above.  No finding to explain the patient's extremity symptoms. The central canal and foramina widely patent at all levels.  Scoliosis.   Electronically Signed   By: Inge Rise M.D.   On: 09/04/2014 15:04   Ct Cta Abd/pel W/cm &/or W/o Cm  09/04/2014   CLINICAL DATA:  Bilateral leg pain. History of polycythemia vera. Concern for dissection of the abdominal aorta or in the thrombosis.  EXAM: CTA ABDOMEN AND  PELVIS wITHOUT AND WITH CONTRAST  TECHNIQUE: Multidetector CT imaging of the abdomen and pelvis was performed using the standard protocol during bolus administration of intravenous contrast. Multiplanar reconstructed images and MIPs were obtained and reviewed to evaluate the vascular anatomy.  CONTRAST:  177mL OMNIPAQUE IOHEXOL 350 MG/ML SOLN, 1107mL OMNIPAQUE IOHEXOL 350 MG/ML SOLN  COMPARISON:  None.  FINDINGS: Initial scan had poor concentration the bolus in the abdominal aorta. Scan was repeated with little improvement of the IV contrast bolus within the abdominal aorta. With this limitation, there are no evidence of dissection of the abdominal aorta. There is no aneurysm of abdominal aorta. Branch vessels are grossly  patent. Iliac arteries are grossly normal. No gross evidence of thrombus.  The kidneys enhance symmetrically.  Hepatic artery is patent.  The spleen is enlarged measuring 20 cm in craniocaudad dimension. The spleen vein is also enlarged. Portal veins are patent.  The lung bases are clear. No pulmonary edema or interstitial edema. Heart is grossly normal.  Liver, gallbladder, pancreas, adrenal glands, kidneys are normal.  The stomach, small bowel, appendix, cecum normal. Colon and rectosigmoid colon are normal.  The prostate gland and bladder normal.  Review of the MIP images confirms the above findings.  IMPRESSION: 1. No gross evidence of aortic dissection or thrombus within the aorta or branches in suboptimal CTA exam. 2. 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 Portal veins are patent. Findings conveyed Penelope Galas on 09/04/2014  at12:34.   Electronically Signed   By: Suzy Bouchard M.D.   On: 09/04/2014 12:38    Microbiology: No results found for this or any previous visit (from the past 240 hour(s)).   Labs: Basic Metabolic Panel:  Recent Labs Lab 09/04/14 0829 09/05/14 0641  NA 136 137  K  4.4 3.8  CL 104 103  CO2 21 24  GLUCOSE 285* 175*  BUN 11 9  CREATININE 0.93 0.79  CALCIUM 8.7 8.7   Liver Function Tests:  Recent Labs Lab 09/05/14 0641  AST 17  ALT 19  ALKPHOS 141*  BILITOT 1.0  PROT 7.1  ALBUMIN 3.4*   No results for input(s): LIPASE, AMYLASE in the last 168 hours. No results for input(s): AMMONIA in the last 168 hours. CBC:  Recent Labs Lab 09/04/14 0829 09/05/14 0641  WBC 13.5* 11.3*  HGB 14.8 14.7  HCT 49.0 48.1  MCV 62.4* 62.0*  PLT 400 429*   Cardiac Enzymes:  Recent Labs Lab 09/04/14 0829 09/04/14 1820 09/05/14 0012 09/05/14 0641  CKTOTAL 39  --   --   --   TROPONINI  --  <0.03 <0.03 <0.03   BNP: BNP (last 3 results) No results for input(s): PROBNP in the last 8760 hours. CBG:  Recent Labs Lab 09/05/14 0621  GLUCAP 164*       Signed:  LAMA,GAGAN S  [facility] 09/05/2014, 2:22 PM

## 2014-11-30 ENCOUNTER — Other Ambulatory Visit: Payer: Self-pay

## 2014-11-30 ENCOUNTER — Ambulatory Visit: Payer: Self-pay | Admitting: Hematology and Oncology

## 2015-08-01 ENCOUNTER — Emergency Department (HOSPITAL_COMMUNITY): Payer: Self-pay

## 2015-08-01 ENCOUNTER — Emergency Department (HOSPITAL_COMMUNITY)
Admission: EM | Admit: 2015-08-01 | Discharge: 2015-08-01 | Disposition: A | Discharge status: Home / Self Care | Payer: Self-pay | Attending: Emergency Medicine | Admitting: Emergency Medicine

## 2015-08-01 ENCOUNTER — Encounter (HOSPITAL_COMMUNITY): Payer: Self-pay | Admitting: Emergency Medicine

## 2015-08-01 DIAGNOSIS — Z7982 Long term (current) use of aspirin: Secondary | ICD-10-CM | POA: Insufficient documentation

## 2015-08-01 DIAGNOSIS — D45 Polycythemia vera: Secondary | ICD-10-CM | POA: Insufficient documentation

## 2015-08-01 DIAGNOSIS — R079 Chest pain, unspecified: Secondary | ICD-10-CM | POA: Insufficient documentation

## 2015-08-01 DIAGNOSIS — Z79899 Other long term (current) drug therapy: Secondary | ICD-10-CM | POA: Insufficient documentation

## 2015-08-01 DIAGNOSIS — I10 Essential (primary) hypertension: Secondary | ICD-10-CM | POA: Insufficient documentation

## 2015-08-01 LAB — CBC
HCT: 57.8 % — ABNORMAL HIGH (ref 39.0–52.0)
Hemoglobin: 18.5 g/dL — ABNORMAL HIGH (ref 13.0–17.0)
MCH: 19.5 pg — ABNORMAL LOW (ref 26.0–34.0)
MCHC: 32 g/dL (ref 30.0–36.0)
MCV: 60.9 fL — AB (ref 78.0–100.0)
PLATELETS: 479 10*3/uL — AB (ref 150–400)
RBC: 9.54 MIL/uL — AB (ref 4.22–5.81)
RDW: 20.2 % — AB (ref 11.5–15.5)
WBC: 18.3 10*3/uL — ABNORMAL HIGH (ref 4.0–10.5)

## 2015-08-01 LAB — BASIC METABOLIC PANEL
Anion gap: 11 (ref 5–15)
BUN: 16 mg/dL (ref 6–20)
CALCIUM: 9 mg/dL (ref 8.9–10.3)
CHLORIDE: 101 mmol/L (ref 101–111)
CO2: 25 mmol/L (ref 22–32)
CREATININE: 1.09 mg/dL (ref 0.61–1.24)
GFR calc non Af Amer: >60 mL/min (ref >60)
Glucose, Bld: 277 mg/dL — ABNORMAL HIGH (ref 65–99)
Potassium: 4.2 mmol/L (ref 3.5–5.1)
Sodium: 137 mmol/L (ref 135–145)

## 2015-08-01 LAB — TROPONIN I: TROPONIN I: 0.03 ng/mL (ref <0.031)

## 2015-08-01 LAB — I-STAT TROPONIN, ED: TROPONIN I, POC: 0 ng/mL (ref 0.00–0.08)

## 2015-08-01 LAB — BRAIN NATRIURETIC PEPTIDE: B Natriuretic Peptide: 10.1 pg/mL (ref 0.0–100.0)

## 2015-08-01 MED ORDER — IOHEXOL 350 MG/ML SOLN
100.0000 mL | Freq: Once | INTRAVENOUS | Status: AC | PRN
  Administered 2015-08-01: 100 mL via INTRAVENOUS

## 2015-08-01 MED ORDER — GI COCKTAIL ~~LOC~~
30.0000 mL | Freq: Once | ORAL | Status: AC
  Administered 2015-08-01: 30 mL via ORAL
  Filled 2015-08-01: qty 30

## 2015-08-01 MED ORDER — AMLODIPINE BESYLATE 5 MG PO TABS: 5.0000 mg | ORAL_TABLET | Freq: Every day | ORAL | Status: DC

## 2015-08-01 MED ORDER — SODIUM CHLORIDE 0.9 % IV SOLN
INTRAVENOUS | Status: DC
  Administered 2015-08-01: 20:00:00 via INTRAVENOUS

## 2015-08-01 MED ORDER — OMEPRAZOLE 20 MG PO CPDR: 20.0000 mg | DELAYED_RELEASE_CAPSULE | Freq: Every day | ORAL | Status: DC

## 2015-08-01 NOTE — Discharge Instructions (Signed)
Hyperglycemia °Hyperglycemia occurs when the glucose (sugar) in your blood is too high. Hyperglycemia can happen for many reasons, but it most often happens to people who do not know they have diabetes or are not managing their diabetes properly.  °CAUSES  °Whether you have diabetes or not, there are other causes of hyperglycemia. Hyperglycemia can occur when you have diabetes, but it can also occur in other situations that you might not be as aware of, such as: °Diabetes °· If you have diabetes and are having problems controlling your blood glucose, hyperglycemia could occur because of some of the following reasons: °¨ Not following your meal plan. °¨ Not taking your diabetes medications or not taking it properly. °¨ Exercising less or doing less activity than you normally do. °¨ Being sick. °Pre-diabetes °· This cannot be ignored. Before people develop Type 2 diabetes, they almost always have "pre-diabetes." This is when your blood glucose levels are higher than normal, but not yet high enough to be diagnosed as diabetes. Research has shown that some long-term damage to the body, especially the heart and circulatory system, may already be occurring during pre-diabetes. If you take action to manage your blood glucose when you have pre-diabetes, you may delay or prevent Type 2 diabetes from developing. °Stress °· If you have diabetes, you may be "diet" controlled or on oral medications or insulin to control your diabetes. However, you may find that your blood glucose is higher than usual in the hospital whether you have diabetes or not. This is often referred to as "stress hyperglycemia." Stress can elevate your blood glucose. This happens because of hormones put out by the body during times of stress. If stress has been the cause of your high blood glucose, it can be followed regularly by your caregiver. That way he/she can make sure your hyperglycemia does not continue to get worse or progress to  diabetes. °Steroids °· Steroids are medications that act on the infection fighting system (immune system) to block inflammation or infection. One side effect can be a rise in blood glucose. Most people can produce enough extra insulin to allow for this rise, but for those who cannot, steroids make blood glucose levels go even higher. It is not unusual for steroid treatments to "uncover" diabetes that is developing. It is not always possible to determine if the hyperglycemia will go away after the steroids are stopped. A special blood test called an A1c is sometimes done to determine if your blood glucose was elevated before the steroids were started. °SYMPTOMS °· Thirsty. °· Frequent urination. °· Dry mouth. °· Blurred vision. °· Tired or fatigue. °· Weakness. °· Sleepy. °· Tingling in feet or leg. °DIAGNOSIS  °Diagnosis is made by monitoring blood glucose in one or all of the following ways: °· A1c test. This is a chemical found in your blood. °· Fingerstick blood glucose monitoring. °· Laboratory results. °TREATMENT  °First, knowing the cause of the hyperglycemia is important before the hyperglycemia can be treated. Treatment may include, but is not be limited to: °· Education. °· Change or adjustment in medications. °· Change or adjustment in meal plan. °· Treatment for an illness, infection, etc. °· More frequent blood glucose monitoring. °· Change in exercise plan. °· Decreasing or stopping steroids. °· Lifestyle changes. °HOME CARE INSTRUCTIONS  °· Test your blood glucose as directed. °· Exercise regularly. Your caregiver will give you instructions about exercise. Pre-diabetes or diabetes which comes on with stress is helped by exercising. °· Eat wholesome,   balanced meals. Eat often and at regular, fixed times. Your caregiver or nutritionist will give you a meal plan to guide your sugar intake.  Being at an ideal weight is important. If needed, losing as little as 10 to 15 pounds may help improve blood  glucose levels. SEEK MEDICAL CARE IF:   You have questions about medicine, activity, or diet.  You continue to have symptoms (problems such as increased thirst, urination, or weight gain). SEEK IMMEDIATE MEDICAL CARE IF:   You are vomiting or have diarrhea.  Your breath smells fruity.  You are breathing faster or slower.  You are very sleepy or incoherent.  You have numbness, tingling, or pain in your feet or hands.  You have chest pain.  Your symptoms get worse even though you have been following your caregiver's orders.  If you have any other questions or concerns.   This information is not intended to replace advice given to you by your health care provider. Make sure you discuss any questions you have with your health care provider.   Document Released: 01/17/2001 Document Revised: 10/16/2011 Document Reviewed: 03/30/2015 Elsevier Interactive Patient Education 2016 Elsevier Inc. Nonspecific Chest Pain  Chest pain can be caused by many different conditions. There is always a chance that your pain could be related to something serious, such as a heart attack or a blood clot in your lungs. Chest pain can also be caused by conditions that are not life-threatening. If you have chest pain, it is very important to follow up with your health care provider. CAUSES  Chest pain can be caused by:  Heartburn.  Pneumonia or bronchitis.  Anxiety or stress.  Inflammation around your heart (pericarditis) or lung (pleuritis or pleurisy).  A blood clot in your lung.  A collapsed lung (pneumothorax). It can develop suddenly on its own (spontaneous pneumothorax) or from trauma to the chest.  Shingles infection (varicella-zoster virus).  Heart attack.  Damage to the bones, muscles, and cartilage that make up your chest wall. This can include:  Bruised bones due to injury.  Strained muscles or cartilage due to frequent or repeated coughing or overwork.  Fracture to one or more  ribs.  Sore cartilage due to inflammation (costochondritis). RISK FACTORS  Risk factors for chest pain may include:  Activities that increase your risk for trauma or injury to your chest.  Respiratory infections or conditions that cause frequent coughing.  Medical conditions or overeating that can cause heartburn.  Heart disease or family history of heart disease.  Conditions or health behaviors that increase your risk of developing a blood clot.  Having had chicken pox (varicella zoster). SIGNS AND SYMPTOMS Chest pain can feel like:  Burning or tingling on the surface of your chest or deep in your chest.  Crushing, pressure, aching, or squeezing pain.  Dull or sharp pain that is worse when you move, cough, or take a deep breath.  Pain that is also felt in your back, neck, shoulder, or arm, or pain that spreads to any of these areas. Your chest pain may come and go, or it may stay constant. DIAGNOSIS Lab tests or other studies may be needed to find the cause of your pain. Your health care provider may have you take a test called an ambulatory ECG (electrocardiogram). An ECG records your heartbeat patterns at the time the test is performed. You may also have other tests, such as:  Transthoracic echocardiogram (TTE). During echocardiography, sound waves are used to create a picture  of all of the heart structures and to look at how blood flows through your heart.  Transesophageal echocardiogram (TEE).This is a more advanced imaging test that obtains images from inside your body. It allows your health care provider to see your heart in finer detail.  Cardiac monitoring. This allows your health care provider to monitor your heart rate and rhythm in real time.  Holter monitor. This is a portable device that records your heartbeat and can help to diagnose abnormal heartbeats. It allows your health care provider to track your heart activity for several days, if needed.  Stress tests.  These can be done through exercise or by taking medicine that makes your heart beat more quickly.  Blood tests.  Imaging tests. TREATMENT  Your treatment depends on what is causing your chest pain. Treatment may include:  Medicines. These may include:  Acid blockers for heartburn.  Anti-inflammatory medicine.  Pain medicine for inflammatory conditions.  Antibiotic medicine, if an infection is present.  Medicines to dissolve blood clots.  Medicines to treat coronary artery disease.  Supportive care for conditions that do not require medicines. This may include:  Resting.  Applying heat or cold packs to injured areas.  Limiting activities until pain decreases. HOME CARE INSTRUCTIONS  If you were prescribed an antibiotic medicine, finish it all even if you start to feel better.  Avoid any activities that bring on chest pain.  Do not use any tobacco products, including cigarettes, chewing tobacco, or electronic cigarettes. If you need help quitting, ask your health care provider.  Do not drink alcohol.  Take medicines only as directed by your health care provider.  Keep all follow-up visits as directed by your health care provider. This is important. This includes any further testing if your chest pain does not go away.  If heartburn is the cause for your chest pain, you may be told to keep your head raised (elevated) while sleeping. This reduces the chance that acid will go from your stomach into your esophagus.  Make lifestyle changes as directed by your health care provider. These may include:  Getting regular exercise. Ask your health care provider to suggest some activities that are safe for you.  Eating a heart-healthy diet. A registered dietitian can help you to learn healthy eating options.  Maintaining a healthy weight.  Managing diabetes, if necessary.  Reducing stress. SEEK MEDICAL CARE IF:  Your chest pain does not go away after treatment.  You have  a rash with blisters on your chest.  You have a fever. SEEK IMMEDIATE MEDICAL CARE IF:   Your chest pain is worse.  You have an increasing cough, or you cough up blood.  You have severe abdominal pain.  You have severe weakness.  You faint.  You have chills.  You have sudden, unexplained chest discomfort.  You have sudden, unexplained discomfort in your arms, back, neck, or jaw.  You have shortness of breath at any time.  You suddenly start to sweat, or your skin gets clammy.  You feel nauseous or you vomit.  You suddenly feel light-headed or dizzy.  Your heart begins to beat quickly, or it feels like it is skipping beats. These symptoms may represent a serious problem that is an emergency. Do not wait to see if the symptoms will go away. Get medical help right away. Call your local emergency services (911 in the [place] Do not drive yourself to the hospital.   This information is not intended to replace advice  given to you by your health care provider. Make sure you discuss any questions you have with your health care provider.   Document Released: 05/03/2005 Document Revised: 08/14/2014 Document Reviewed: 02/27/2014 Elsevier Interactive Patient Education 2016 [place] is a condition in which the body makes too many red blood cells and there is no known cause. The red blood cells (erythrocytes) are the cells which carry the oxygen in your blood stream to the cells of your body. Because of the increased red blood cells, the blood becomes thicker and does not circulate as well. It would be similar to your car having oil which is too thick so it cannot start and circulate as well. When the blood is too thick it often causes headaches and dizziness. It may also cause blood clots. Even though the blood clots easier, these patients bleed easier. The bleeding is caused because the blood cells which help stop bleeding (platelets) do not function  normally. It occurs in all age groups but is more common in the 20 to 40 year age range. TREATMENT  The treatment of polycythemia vera for many years has been blood removal (phlebotomy) which is similar to blood removal in a blood bank, however this blood is not used for donation. Hydroxyurea is used to supplement phlebotomy. Aspirin is commonly given to thin the blood as long as the patient does not have a problem with bleeding. Other drugs are used based on the progression of the disease.   This information is not intended to replace advice given to you by your health care provider. Make sure you discuss any questions you have with your health care provider.   Document Released: 04/18/2001 Document Revised: 08/14/2014 Document Reviewed: 02/03/2015 Elsevier Interactive Patient Education 2016 [employer].  Emergency Department Resource Guide 1) Find a Doctor and Pay Out of Pocket Although you won't have to find out who is covered by your insurance plan, it is a good idea to ask around and get recommendations. You will then need to call the office and see if the doctor you have chosen will accept you as a new patient and what types of options they offer for patients who are self-pay. Some doctors offer discounts or will set up payment plans for their patients who do not have insurance, but you will need to ask so you aren't surprised when you get to your appointment.  2) Contact Your Local Health Department Not all health departments have doctors that can see patients for sick visits, but many do, so it is worth a call to see if yours does. If you don't know where your local health department is, you can check in your phone book. The CDC also has a tool to help you locate your state's health department, and many state websites also have listings of all of their local health departments.  3) Find a [facility] If your illness is not likely to be very severe or complicated, you may want to try a  walk in clinic. These are popping up all over the country in pharmacies, drugstores, and shopping centers. They're usually staffed by nurse practitioners or physician assistants that have been trained to treat common illnesses and complaints. They're usually fairly quick and inexpensive. However, if you have serious medical issues or chronic medical problems, these are probably not your best option.  No Primary Care Doctor: - Call Health Connect at  [phone] - they can help you locate a primary care doctor that  accepts your insurance, provides certain services, etc. - Physician Referral Service- [phone]  Chronic Pain Problems: Organization         Address  Phone   Notes  [facility]  [phone] Patients need to be referred by their primary care doctor.   Medication Assistance: Organization         Address  Phone   Notes  [facility] Medication [facility] [address] [place], [place] [address] [phone] --Must be a resident of [facility] -- Must have NO insurance coverage whatsoever (no Medicaid/ Medicare, etc.) -- The pt. MUST have a primary care doctor that directs their care regularly and follows them in the community   Med[facility]ist  [phone]   [employer]  [phone]    Agencies that provide inexpensive medical care: Organization         Address  Phone   Notes  [place]  [phone]   [facility]    [phone]   [facility] [place], [place] [address] [phone]   [place] [address], [place] [phone]   Planned Parenthood    [phone]   [facility]    [phone]   [place] and [address], [place] Phone:  [phone], Fax:  [phone] Hours of Operation:  9 am - 6 pm, M-F.  Also accepts Medicaid/Medicare and self-pay.  [facility] [place] for [place], [address], [place] Phone: [phone], Fax: [phone]. Hours of Operation:  8:30 am - 5:30 pm, M-F.  Also accepts Medicaid and self-pay.  [facility] [address], [place] Phone: [phone]   [place], [place], [place] [phone], Ext. [phone] Mondays & Thursdays: 7-9 AM.  First 15 patients are seen on a first come, first serve basis.    [place] Providers:  Organization         Address  Phone   Notes  [facility] [address], [place] [phone] Also accepts self-pay patients.  [facility] [id number] [place], [place]  [phone]   [place], [address], [place] [phone]   [facility] [address] [place] [phone]   Lucianne Lei [address], [place]   [phone] Only accepts [place] Access [place] patients after they have their name applied to their card.   Self-Pay (no insurance) in [facility]:  Organization         Address  Phone   Notes  Sickle Cell Patients, [facility] [place] [phone]   [facility] Care [place] [phone]   [facility] [place]  [place], [place], [place] [phone]   [facility]/Dr. Osei-Bonsu  [address], [place] or [address], [place] [phone] Phone number for both [place] and [place] locations is the same.  Urgent Medical and [facility] [address], [place] [phone]   [facility] [address] [place] or [address] [phone]   [facility]  [place] [phone], phone; [phone], fax Sees  patients 1st and 3rd Saturday of every month.  Must not qualify for public or private insurance (i.e. Medicaid, Medicare, [place] Health Choice, Veterans' Benefits)  Household income should be no more than 200% of the poverty level The clinic cannot treat you if you are pregnant or think you are pregnant  Sexually transmitted diseases are not treated at the clinic.    Dental Care: Organization         Address  Phone  Notes  [facility] Department of [facility] [place] [phone] Accepts children up to age 25 who are enrolled in [place] or [place]; pregnant women with a Medicaid card; and children who have applied for Medicaid or [place] Health Choice, but were declined, whose parents can pay a reduced fee at time of service.  [facility] Department of [facility]  [address], [place] [phone] Accepts children up to age 28 who are enrolled in [place] or [place]; pregnant women with a Medicaid card; and children who have applied for Medicaid or [place] Health Choice, but were declined, whose parents can pay a reduced fee at time of service.  [place] Adult Dental Access PROGRAM  [place] [phone] Patients are seen by appointment only. Walk-ins are not accepted. [place] will see patients 59 years of age and older. Monday - Tuesday (8am-5pm) Most Wednesdays (8:30-5pm) $30 per visit, cash only  [facility] Adult Dental Access PROGRAM  [address], [facility] [phone] Patients are seen by appointment only. Walk-ins are not accepted. [place] will see patients 60 years of age and older. One Wednesday Evening (Monthly: Volunteer Based).  $30 per visit, cash only  [place]  [phone] for adults; Children under age 36, call Graduate Pediatric Dentistry at [phone]. Children aged 41-14, please call [phone] to request a  pediatric application.  Dental services are provided in all areas of dental care including fillings, crowns and bridges, complete and partial dentures, implants, gum treatment, root canals, and extractions. Preventive care is also provided. Treatment is provided to both adults and children. Patients are selected via a lottery and there is often a waiting list.   [facility] [address] [place]  [phone] [email or web address]   [facility] [address] [place], [place] [phone], Ext. [phone] Second and Fourth Thursday of each month, opens at 6:30 AM; Clinic ends at 9 AM.  Patients are seen on a first-come first-served basis, and a limited number are seen during each clinic.   [facility]  [address] Hillard Danker [place], [place] [phone]   Eligibility Requirements You must have lived in [place], [place], or [place] counties for at least the last three months.   You cannot be eligible for state or federal sponsored [employer], including [employer], [place], or [employer].   You generally cannot be eligible for healthcare insurance through your employer.    How to apply: Eligibility screenings are held every Tuesday and Wednesday afternoon from 1:00 pm until 4:00 pm. You do not need an appointment for the interview!  [facility] [address] [place], [place]   [place]  [place]  [place]  [phone]  Behavioral Health Resources in the Community: Intensive Outpatient Programs Organization         Address  Phone  Notes  [place] [facility]. [address] [place], [place] [phone]   [facility] Outpatient [address], [place], [place]   ADS: [facility] Drug [facility] [address], [place], [place]   [place] [address]  [place], [place] or [phone]   Substance Abuse Resources Organization         Address  Phone  Notes  Alcohol and Drug Services  [phone]   [place]  [phone]   [facility]   Chinita Pester  [phone]   Residential & Outpatient Substance Abuse Program  [phone]   Psychological Services Organization         Address  Phone  Notes  [facility] [place]  [place]  [phone]   [place] [address], [place] or [phone]    Mobile Crisis Teams Organization         Address  Phone  Notes  [facility], Mobile Crisis Care Unit  [phone]   [facility]  [address] [place], [place]   [place] Levels [address] [place] [phone]    Self-Help/Support Groups Organization         Address  Phone             Notes  [place]. of [place] - variety of support groups  [place] Call for more information  Narcotics Anonymous (NA), [facility] [address], [employer] [place]  2 meetings at this location   Special educational needs teacher         Address  Phone  Notes  [facility] [place],    [place]  [phone]   [facility]  [address] [place] [phone], [place], [place]   [place] [facility], [place] [phone] Admissions: 8am-3pm M-F  Incentives Substance [place] [address],    [place], [place] [phone]   [facility] [address] [place], [place], [place]   [facility] [address]  [place], [place]   [facility] Programs - Intensive Outpatient [address] Kristeen [address], [place], [place]   [facility] ([place].,    [place], [place] [phone] or [phone]   [facility] Services (RTS) [address], [place], [place] Accepts Medicaid  [facility] [address],  [place] [phone] Substance Abuse/Addiction Treatment   [facility] Organization         Address  Phone  Notes  [facility]  [phone]   Domenic Schwab, PhD [address] Arlis [place], [place]   [phone] or [phone]   [place], [place] [phone]   [place] [address] [place], [place] [phone] Insurance/Medicaid/sponsorship through [employer] and Families [address], [place]                                    [place], [place] [phone] Therapy/tele-psych/case  [facility] [address]  [place], [place] [phone]    Dr. Adele Schilder  [phone]   [facility] [place] Dept. 1) [address] [place] 2) [place] 3)  [place] [address], [place] [phone]  [phone]   [place] [phone] or [phone] (After Hours)

## 2015-08-01 NOTE — ED Notes (Signed)
Per pt, states his BP is high-states he is having epigastric pain-history of polycythemia vera and currently not being treated

## 2015-08-01 NOTE — ED Provider Notes (Signed)
CSN: [id number]     Arrival date & time 08/01/15  1816 History   First MD Initiated Contact with Patient 08/01/15 1836     Chief Complaint  Patient presents with  . 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 He reports that he has not had treatment for the past year. He reports that he has felt well up until today. Today he started getting pain in his chest and feeling lightheaded and dizzy with shortness of breath. Wife also reports that he was getting very sweaty. Initially he reports the pain was more mild in his epigastric region. He reports later in the day however it was more intense and he felt worse. That time he measured his blood pressure on his father's blood pressure machine and noted his blood pressure was very high, 170s/110s. He however had not been measuring his blood pressure over the past year. He denies any problems with exertional dyspnea or chest pain. Past Medical History  Diagnosis Date  . Hypertension   . Polycythemia    Past Surgical History  Procedure Laterality Date  . Dental surgery     Family History  Problem Relation Age of Onset  . Diabetes Father    Social History  Substance Use Topics  . Smoking status: Never Smoker   . Smokeless tobacco: Never Used  . Alcohol Use: Yes     Comment: occ    Review of Systems  10 Systems reviewed and are negative for acute change except as noted in the HPI.   Allergies  Adhesive  Home Medications   Prior to Admission medications   Medication Sig Start Date End Date Taking? Authorizing Provider  acetaminophen (TYLENOL) 500 MG tablet Take 2,000 mg by mouth every 6 (six) hours as needed (For pain.).   Yes Historical Provider, MD  aspirin 81 MG chewable tablet Chew 1 tablet (81 mg total) by mouth daily. 06/01/14  Yes Nishant Dhungel, MD  ibuprofen (ADVIL,MOTRIN) 200 MG tablet Take 800 mg  by mouth every 6 (six) hours as needed (For pain.).    Yes Historical Provider, MD  Menthol-Methyl Salicylate (MUSCLE RUB) 10-15 % CREA Apply 1 application topically daily as needed for muscle pain (Applies to right leg pain.).   Yes Historical Provider, MD  Multiple Vitamin (MULTIVITAMIN WITH MINERALS) TABS tablet Take 2 tablets by mouth daily.   Yes Historical Provider, MD  amLODipine (NORVASC) 5 MG tablet Take 1 tablet (5 mg total) by mouth daily. Patient not taking: Reported on 08/01/2015 09/05/14   Oswald Hillock, MD  amLODipine (NORVASC) 5 MG tablet Take 1 tablet (5 mg total) by mouth daily. 08/01/15   Charlesetta Shanks, MD  omeprazole (PRILOSEC) 20 MG capsule Take 1 capsule (20 mg total) by mouth daily. 08/01/15   Charlesetta Shanks, MD   BP 127/90 mmHg  Pulse 87  Temp(Src) 97.7 F (36.5 C) (Oral)  Resp 29  SpO2 95% Physical Exam  Constitutional: He is oriented to person, place, and time. He appears well-developed and well-nourished.  HENT:  Head: Normocephalic and atraumatic.  Eyes: EOM are normal. Pupils are equal, round, and reactive to light.  Neck: Neck supple.  Cardiovascular: Normal rate, regular rhythm, normal heart sounds and intact distal pulses.   Pulmonary/Chest: Effort normal and breath sounds normal.  Abdominal: Soft. Bowel sounds are normal. He exhibits no distension. There is no tenderness.  Musculoskeletal: Normal range of motion. He exhibits no edema or tenderness.  Neurological: He is alert and oriented to person, place, and time. He has normal strength. Coordination normal. GCS eye subscore is 4. GCS verbal subscore is 5. GCS motor subscore is 6.  Skin: Skin is warm, dry and intact.  Psychiatric: He has a normal mood and affect.    ED Course  Procedures (including critical care time) Labs Review Labs Reviewed  BASIC METABOLIC PANEL - Abnormal; Notable for the following:    Glucose, Bld 277 (*)    All other components within normal limits  CBC - Abnormal; Notable  for the following:    WBC 18.3 (*)    RBC 9.54 (*)    Hemoglobin 18.5 (*)    HCT 57.8 (*)    MCV 60.9 (*)    MCH 19.5 (*)    RDW 20.2 (*)    Platelets 479 (*)    All other components within normal limits  BRAIN NATRIURETIC PEPTIDE  TROPONIN I  HEMOGLOBIN A1C  I-STAT TROPOININ, ED    Imaging Review Dg Chest 2 View  08/01/2015  CLINICAL DATA:  Mid chest pain. EXAM: CHEST  2 VIEW COMPARISON:  05/27/2014 FINDINGS: Cardiomediastinal silhouette is normal. Mediastinal contours appear intact. There is no evidence of focal airspace consolidation, pleural effusion or pneumothorax. Osseous structures are without acute abnormality. Soft tissues are grossly normal. IMPRESSION: No active cardiopulmonary disease. Electronically Signed   By: Fidela Salisbury M.D.   On: 08/01/2015 18:56   Ct Angio Chest Pe W/cm &/or Wo Cm  08/01/2015  CLINICAL DATA:  Chest pain. Polycythemia. Rule out pulmonary embolus. EXAM: CT ANGIOGRAPHY CHEST WITH CONTRAST TECHNIQUE: Multidetector CT imaging of the chest was performed using the standard protocol during bolus administration of intravenous contrast. Multiplanar CT image reconstructions and MIPs were obtained to evaluate the vascular anatomy. CONTRAST:  168mL OMNIPAQUE IOHEXOL 350 MG/ML SOLN 2 boluses of contrast ir given due to suboptimal pulmonary artery opacification of the first attempt. COMPARISON:  Chest x-ray 08/01/2015. FINDINGS: Negative for pulmonary embolism. Pulmonary arteries mildly prominent in caliber. Negative for aortic aneurysm or dissection. Heart size normal. No pericardial effusion. No significant coronary calcification. Negative for infiltrate or effusion. Negative for mass or adenopathy. Calcified small granulomata in the right middle lobe and right lower lobe. Chronic deformity of the right first rib anteriorly which may be congenital or due to chronic non healed fracture. Upper abdomen negative. Review of the MIP images confirms the above findings.  IMPRESSION: Negative for pulmonary embolism.  No acute abnormality in the chest. Electronically Signed   By: Franchot Gallo M.D.   On: 08/01/2015 20:58   I have personally reviewed and evaluated these images and lab results as part of my medical decision-making.   EKG Interpretation   Date/Time:  Sunday August 01 2015 18:23:26 EST Ventricular Rate:  83 PR Interval:  152 QRS Duration: 118 QT Interval:  375 QTC Calculation: 441 R Axis:   -34 Text Interpretation:  Sinus rhythm Probable left atrial enlargement  Incomplete RBBB and LAFB Confirmed by Johnney Killian, MD, Jeannie Done [phone]) on  08/01/2015 6:26:25 PM     Consult: Patient's case was reviewed with heme oncology, Dr. Inda Merlin. He advised to remove one unit of blood by phlebotomy, the patient continues take his daily aspirin and follow up with Dr. Simeon Craft such as an outpatient. MDM   Final diagnoses:  Polycythemia vera ([place])  Chest pain, unspecified chest pain type   Patient developed a CT PE study and no clot was present. 2 sets of cardiac enzymes  are negative for elevated troponin. The patient's blood pressure spontaneously improved to 120s over 90s. Review of medical record indicates that the patient was supposed event started on Norvasc at the beginning of this year. Patient reports that he was unaware of this. I suspect he has had chronic diastolic hypertension as he has not been monitoring it. He also reports he was unaware of some recurrent hyperglycemia. Patient is counseled on the necessity of follow-up with primary care for both of these conditions as well as recheck with Dr. Simeon Craft such for hematology. Patient is discharged in good condition. He is alert and nontoxic with stable vital signs and no chest pain. One unit of blood was removed by nursing staff for treatment of polycythemia vera.    Charlesetta Shanks, MD 08/01/15 [phone]

## 2015-08-03 ENCOUNTER — Telehealth: Payer: Self-pay | Admitting: *Deleted

## 2015-08-03 LAB — HEMOGLOBIN A1C
HEMOGLOBIN A1C: 11.1 % — AB (ref 4.8–5.6)
MEAN PLASMA GLUCOSE: 272 mg/dL

## 2015-08-03 NOTE — Telephone Encounter (Signed)
LM with note below.. Asked for return call to schedule appt.

## 2015-08-03 NOTE — Telephone Encounter (Signed)
-----   Message from Heath Lark, MD sent at 08/03/2015  7:54 AM EST ----- Regarding: return appt Patient had multiple no-shows in the past He went to the ER with grossly abnormal CBC He needs to be seen soon with phlebotomy Can you call him? I can see him tomorrow between 1230 to 230 pm or Thurs morning 9 or 930?

## 2015-08-05 ENCOUNTER — Telehealth: Payer: Self-pay | Admitting: *Deleted

## 2015-08-05 NOTE — Telephone Encounter (Signed)
LM for patient to call Dr Alvy Bimler nurse regarding phlebotomy.

## 2015-08-05 NOTE — Telephone Encounter (Signed)
-----   Message from Heath Lark, MD sent at 08/03/2015  7:54 AM EST ----- Regarding: return appt Patient had multiple no-shows in the past He went to the ER with grossly abnormal CBC He needs to be seen soon with phlebotomy Can you call him? I can see him tomorrow between 1230 to 230 pm or Thurs morning 9 or 930?

## 2015-12-21 IMAGING — CT CT PELVIS W/ CM
1 series · 16 of 32 positions shown, 20 images · IV contrast (omnipaque)
Comparison: None.

CLINICAL DATA: Scrotal and groin swelling for the past 3 days.
Clinical concern for abscess. This began 4 days ago. Some fever and
chills.

EXAM:
CT PELVIS WITH CONTRAST
TECHNIQUE: Multidetector CT imaging of the pelvis was performed using the
standard protocol following the bolus administration of intravenous
contrast.
CONTRAST:  100mL OMNIPAQUE IOHEXOL 300 MG/ML  SOLN

[Series 2: pel with · axial · 0.95mm/px · z∈[-358,+12]mm · 16 of 83 slices shown, 20 images]
[im 6/83  soft-tissue]
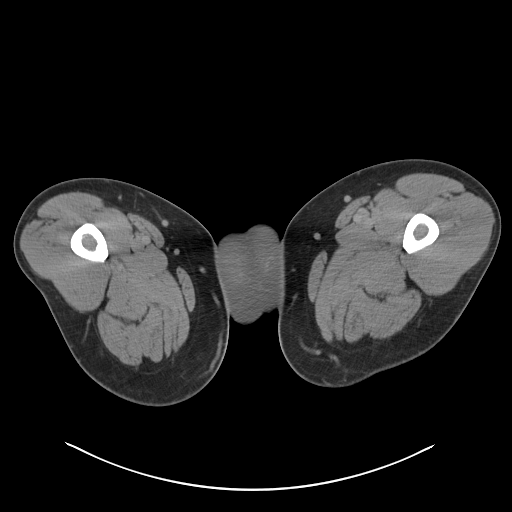
[im 6/83  bone]
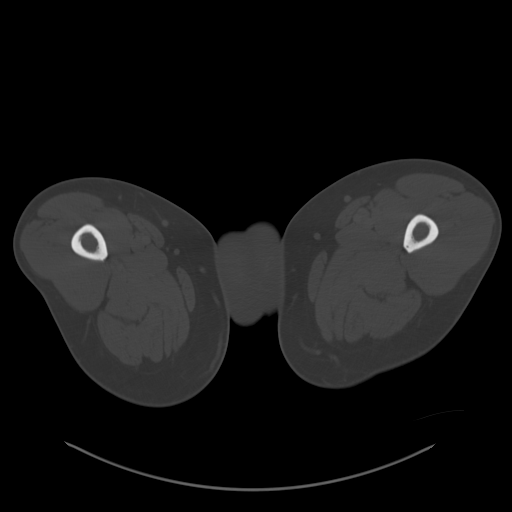
[im 11/83  soft-tissue]
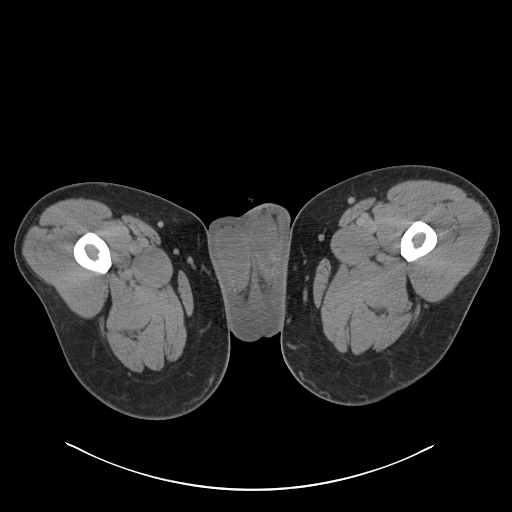
[im 16/83  soft-tissue]
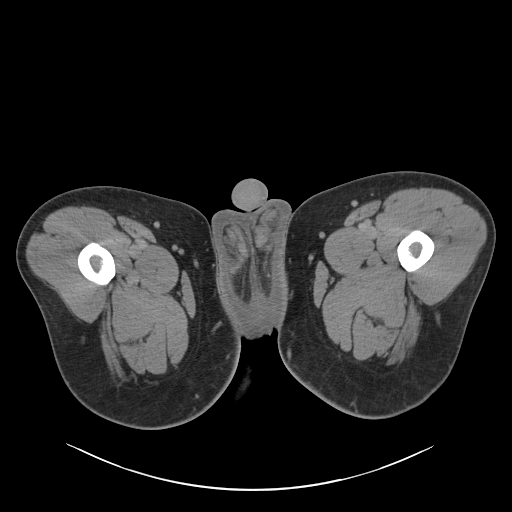
[im 22/83  soft-tissue]
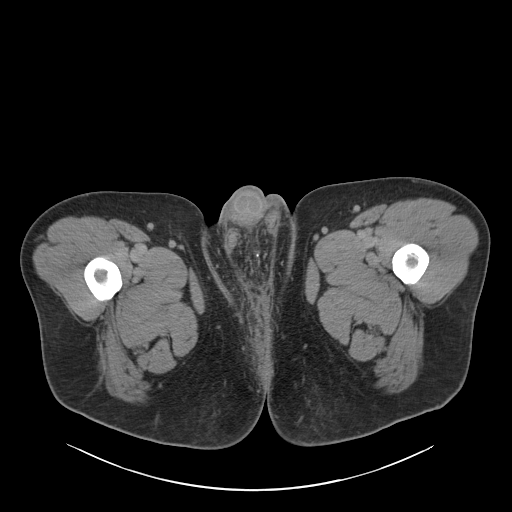
[im 27/83  soft-tissue]
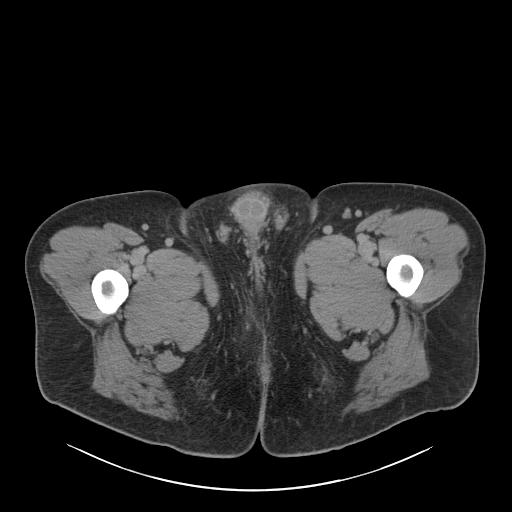
[im 32/83  soft-tissue]
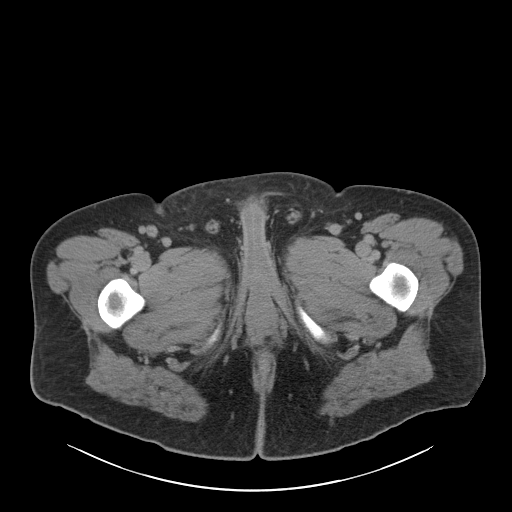
[im 38/83  soft-tissue]
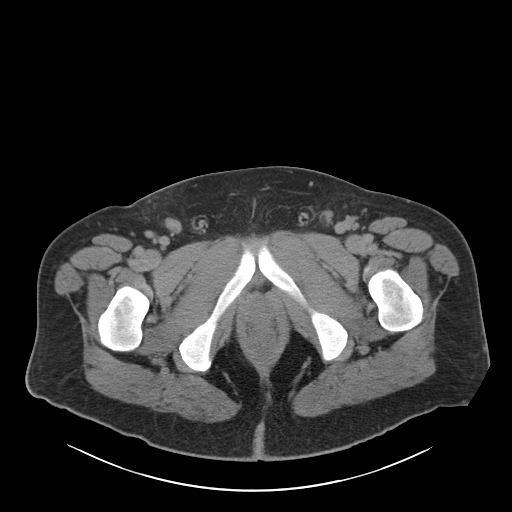
[im 45/83  soft-tissue]
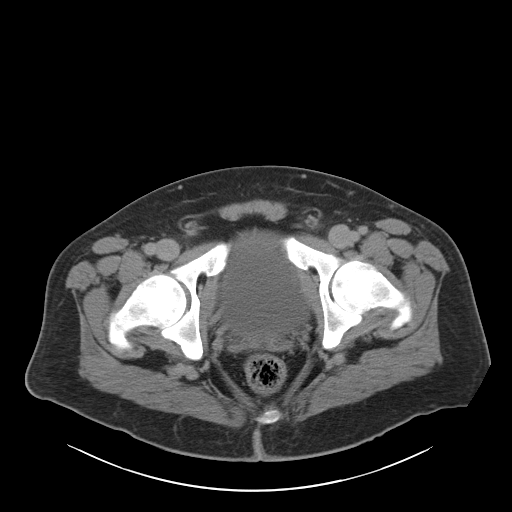
[im 51/83  soft-tissue]
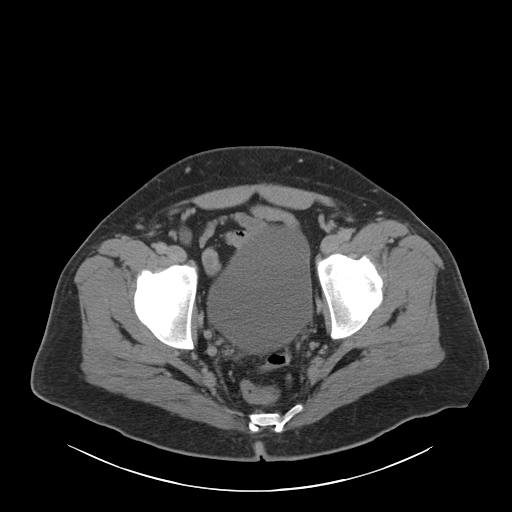
[im 51/83  bone]
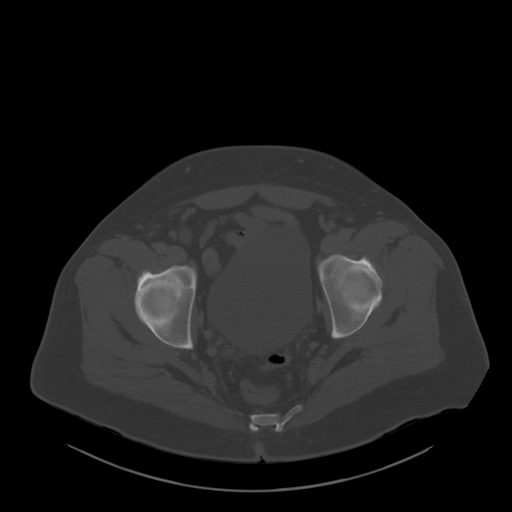
[im 56/83  soft-tissue]
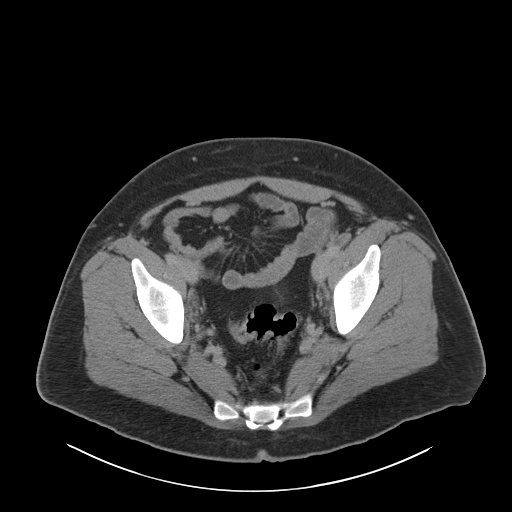
[im 61/83  soft-tissue]
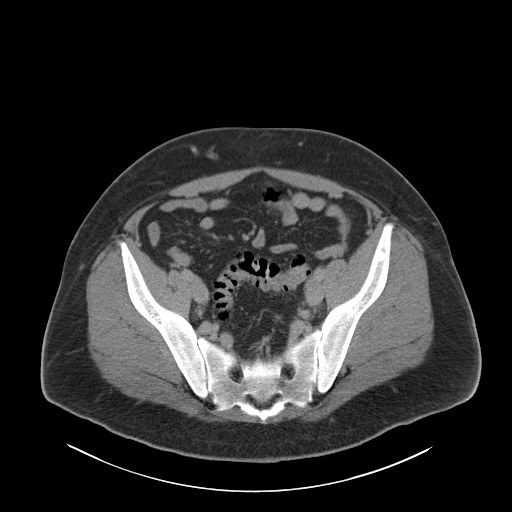
[im 67/83  soft-tissue]
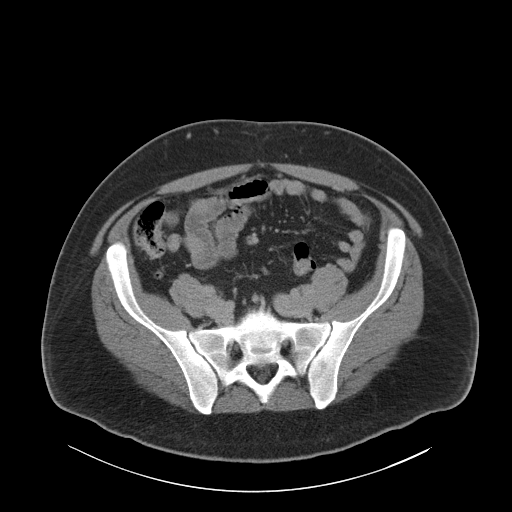
[im 72/83  soft-tissue]
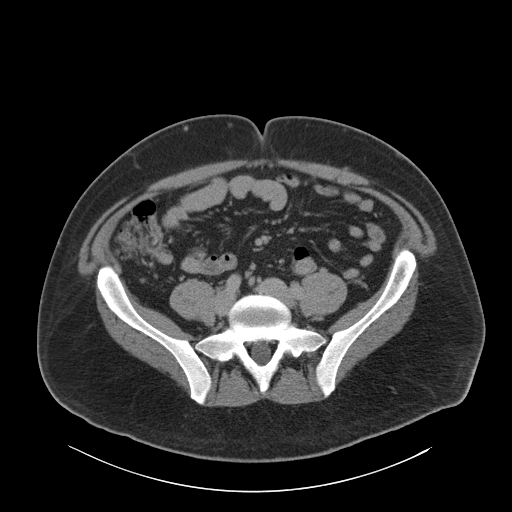
[im 72/83  lung]
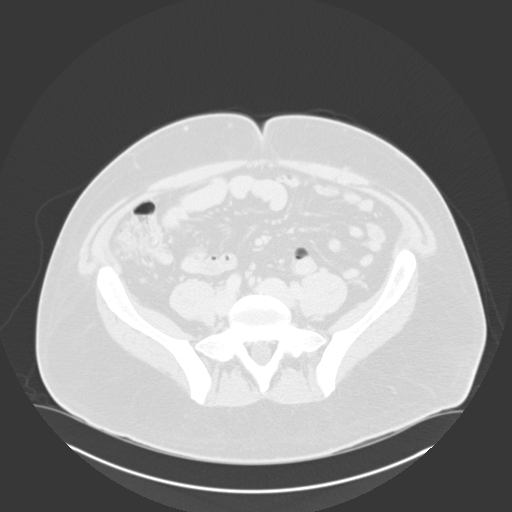
[im 75/83  lung]
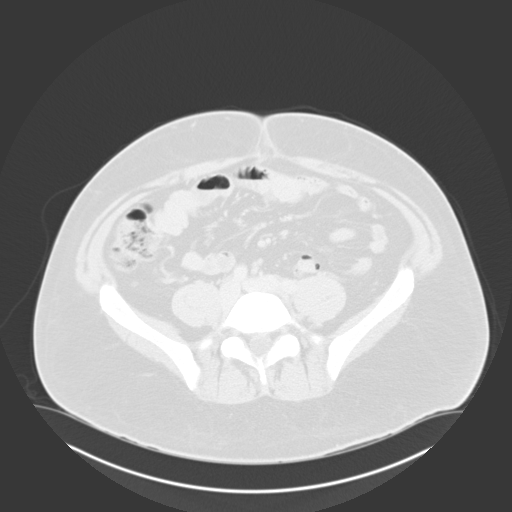
[im 77/83  soft-tissue]
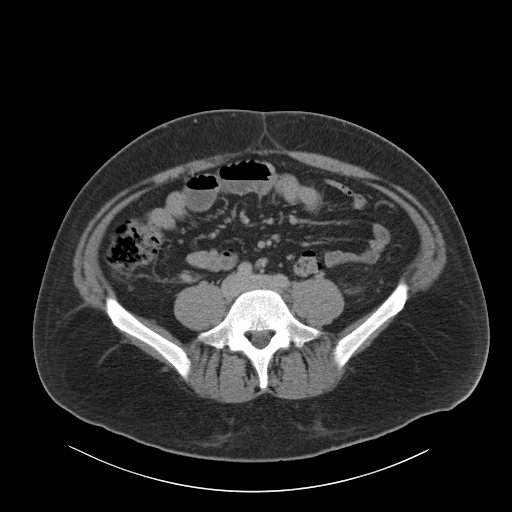
[im 77/83  lung]
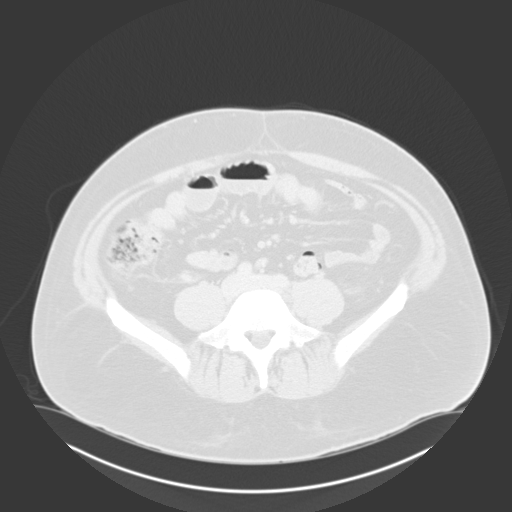
[im 80/83  lung]
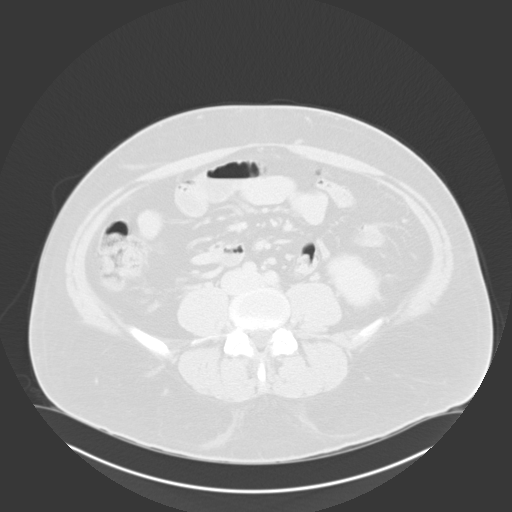

[16 of 32 positions shown; findings below may reference images not displayed]

FINDINGS: Diffuse low density scrotal skin thickening. The testicles and
epididymi have normal CT appearances. No discrete fluid collections
are seen. No intestinal abnormalities. Normal appearing appendix,
urinary bladder and prostate gland. Mildly prominent bilateral
inguinal lymph nodes. The largest is on the left, with a short axis
diameter of 12 mm on image number 47. This has a preserved fatty
hilum. Unremarkable bones.
IMPRESSION: Diffuse scrotal cellulitis without abscess.

## 2015-12-24 IMAGING — CR DG CHEST 2V
2 series · 2 of 2 positions shown · non-contrast
Comparison: None.

CLINICAL DATA: Cough, shortness of breath.

EXAM:
CHEST  2 VIEW

[w chest pa]
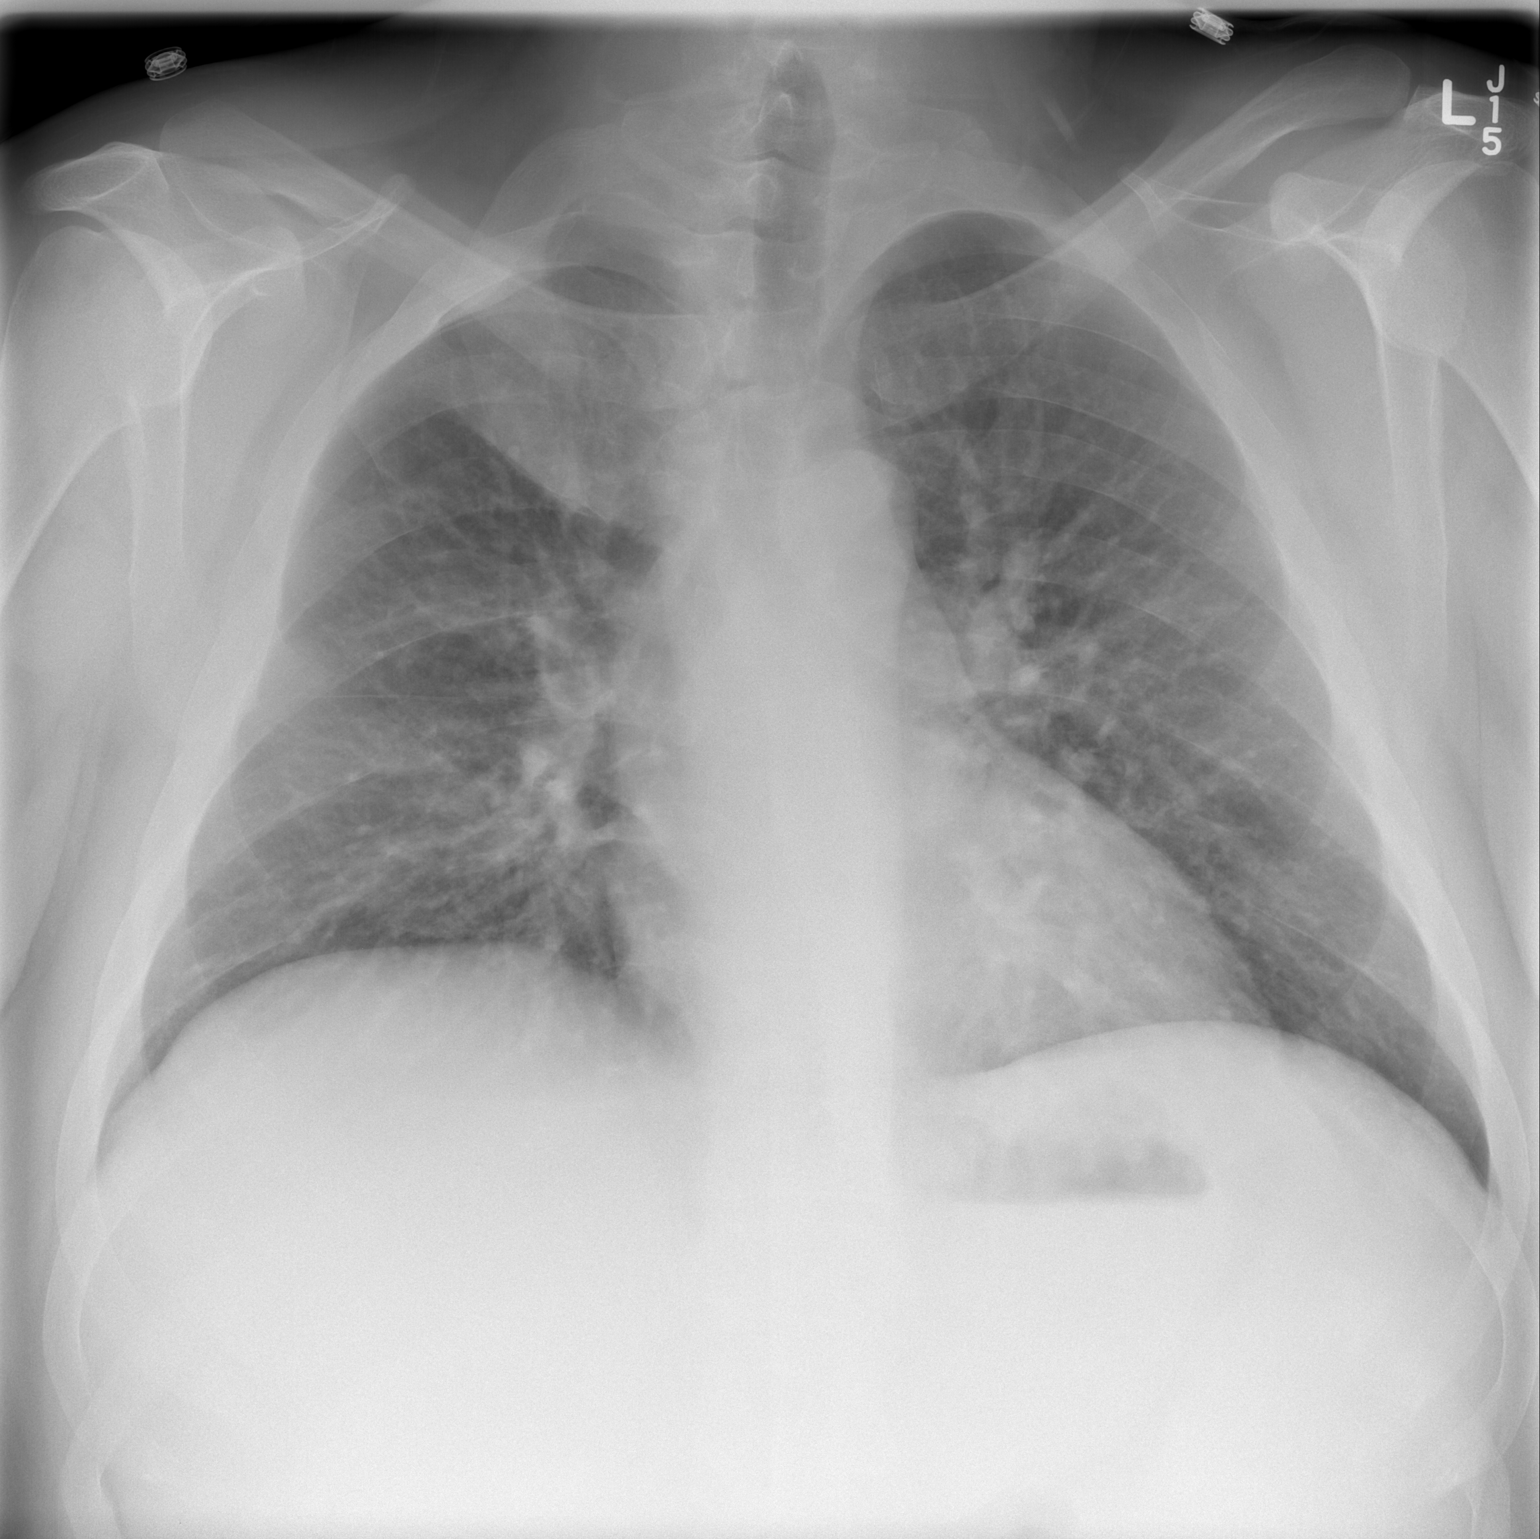

[w chest lat]
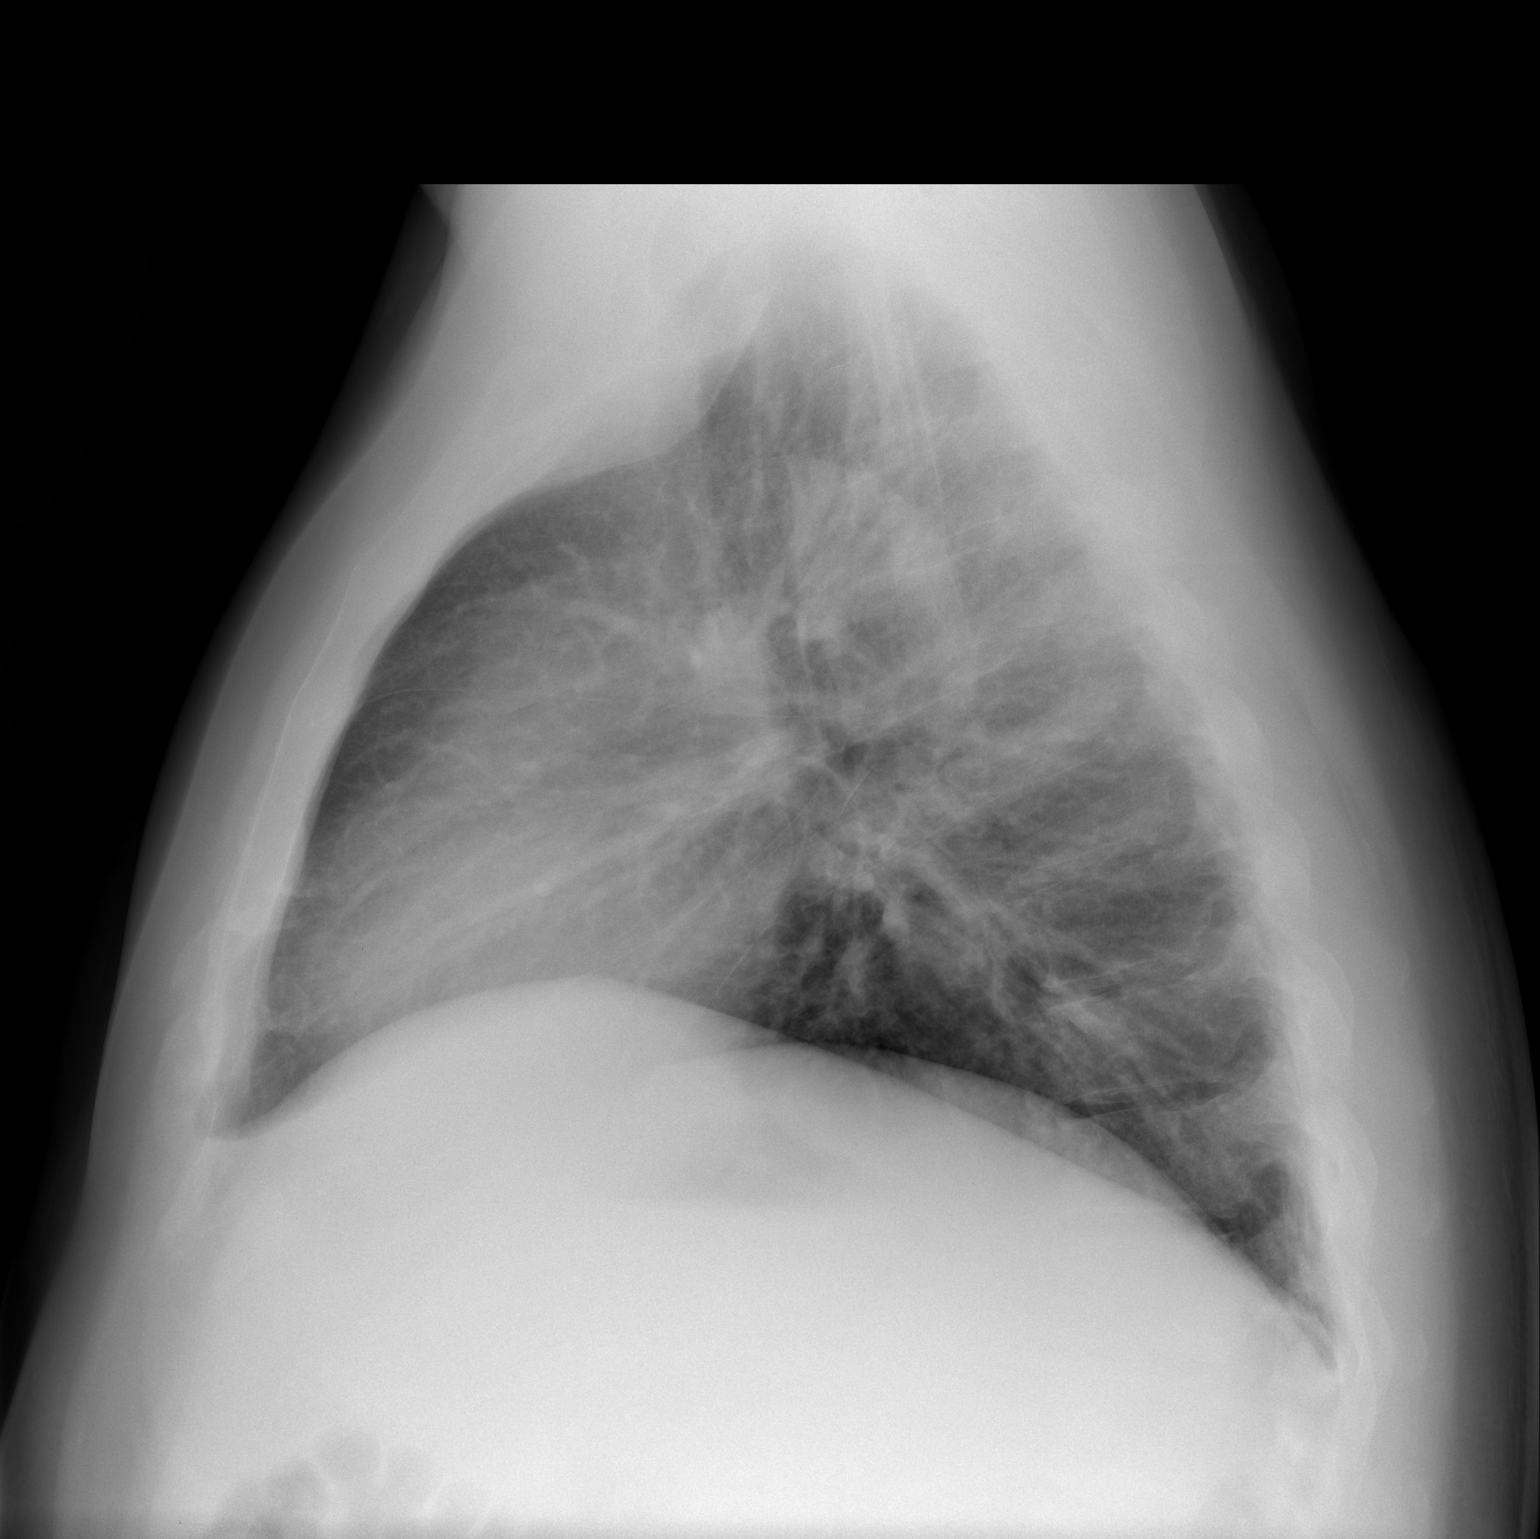

[2 of 2 positions shown; findings below may reference images not displayed]

FINDINGS: The heart size and mediastinal contours are within normal limits. No
pneumothorax or pleural effusion is noted. Right upper lobe airspace
opacity is noted concerning for atelectasis or possibly pneumonia.
Left lung is clear. The visualized skeletal structures are
unremarkable.
IMPRESSION: Right upper lobe airspace opacity is noted concerning for pneumonia
or atelectasis. Short-term follow-up radiographs are recommended to
ensure resolution to rule out the possibility of underlying
neoplasm.

## 2016-11-13 ENCOUNTER — Emergency Department (HOSPITAL_COMMUNITY)
Admission: EM | Admit: 2016-11-13 | Discharge: 2016-11-13 | Disposition: A | Discharge status: Home / Self Care | Payer: Self-pay | Attending: Emergency Medicine | Admitting: Emergency Medicine

## 2016-11-13 ENCOUNTER — Encounter (HOSPITAL_COMMUNITY): Payer: Self-pay | Admitting: Emergency Medicine

## 2016-11-13 DIAGNOSIS — Z79899 Other long term (current) drug therapy: Secondary | ICD-10-CM | POA: Insufficient documentation

## 2016-11-13 DIAGNOSIS — Z7982 Long term (current) use of aspirin: Secondary | ICD-10-CM | POA: Insufficient documentation

## 2016-11-13 DIAGNOSIS — I10 Essential (primary) hypertension: Secondary | ICD-10-CM | POA: Insufficient documentation

## 2016-11-13 DIAGNOSIS — D45 Polycythemia vera: Secondary | ICD-10-CM | POA: Insufficient documentation

## 2016-11-13 LAB — CBC WITH DIFFERENTIAL/PLATELET
BASOS ABS: 0.2 10*3/uL — AB (ref 0.0–0.1)
Basophils Relative: 1 %
EOS PCT: 1 %
Eosinophils Absolute: 0.2 10*3/uL (ref 0.0–0.7)
HCT: 63.3 % — ABNORMAL HIGH (ref 39.0–52.0)
Hemoglobin: 20.3 g/dL — ABNORMAL HIGH (ref 13.0–17.0)
LYMPHS ABS: 0.9 10*3/uL (ref 0.7–4.0)
Lymphocytes Relative: 5 %
MCH: 20.4 pg — ABNORMAL LOW (ref 26.0–34.0)
MCHC: 32.1 g/dL (ref 30.0–36.0)
MCV: 63.7 fL — ABNORMAL LOW (ref 78.0–100.0)
MONO ABS: 0.7 10*3/uL (ref 0.1–1.0)
Monocytes Relative: 4 %
NEUTROS PCT: 89 %
Neutro Abs: 15.6 10*3/uL — ABNORMAL HIGH (ref 1.7–7.7)
PLATELETS: 373 10*3/uL (ref 150–400)
RBC: 9.96 MIL/uL — AB (ref 4.22–5.81)
RDW: 20.5 % — AB (ref 11.5–15.5)
WBC: 17.6 10*3/uL — AB (ref 4.0–10.5)

## 2016-11-13 LAB — BASIC METABOLIC PANEL
Anion gap: 9 (ref 5–15)
BUN: 11 mg/dL (ref 6–20)
CHLORIDE: 103 mmol/L (ref 101–111)
CO2: 25 mmol/L (ref 22–32)
Calcium: 8.6 mg/dL — ABNORMAL LOW (ref 8.9–10.3)
Creatinine, Ser: 1.08 mg/dL (ref 0.61–1.24)
GFR calc non Af Amer: >60 mL/min (ref >60)
Glucose, Bld: 187 mg/dL — ABNORMAL HIGH (ref 65–99)
Potassium: 4.2 mmol/L (ref 3.5–5.1)
SODIUM: 137 mmol/L (ref 135–145)

## 2016-11-13 LAB — POC OCCULT BLOOD, ED: Fecal Occult Bld: NEGATIVE

## 2016-11-13 MED ORDER — ONDANSETRON HCL 4 MG/2ML IJ SOLN
4.0000 mg | Freq: Once | INTRAMUSCULAR | Status: AC
  Administered 2016-11-13: 4 mg via INTRAVENOUS
  Filled 2016-11-13: qty 2

## 2016-11-13 MED ORDER — SODIUM CHLORIDE 0.9 % IV BOLUS (SEPSIS)
1000.0000 mL | Freq: Once | INTRAVENOUS | Status: AC
  Administered 2016-11-13: 1000 mL via INTRAVENOUS

## 2016-11-13 NOTE — ED Notes (Signed)
ED Provider at bedside. 

## 2016-11-13 NOTE — Discharge Instructions (Signed)
Read the information below.  You may return to the Emergency Department at any time for worsening condition or any new symptoms that concern you. °

## 2016-11-13 NOTE — Care Management (Signed)
ED CM met with patient at bedside to discuss establishing  follow up care.  Discussed the [facility] patient is agreeable with establishing care at the clinic. ED CM notified Clinic CM regarding arranging follow up appt. CM will contact patient tomorrow to arrange follow up. Updated patient that clinic CM will reach out to him tomorrow.

## 2016-11-13 NOTE — ED Notes (Signed)
Occult card at bedside 

## 2016-11-13 NOTE — Progress Notes (Signed)
Therapeutic phlebotomy done for 500cc as ordered,pt tolerated well.

## 2016-11-13 NOTE — ED Notes (Signed)
Patient given ice chips. 

## 2016-11-13 NOTE — ED Triage Notes (Signed)
Patient states his blood was 150/110 at home. Patient does not take blood pressure medication. Patient was told his HTN was due to his polycythemia.

## 2016-11-13 NOTE — ED Notes (Signed)
IV team at bedside 

## 2016-11-13 NOTE — ED Provider Notes (Signed)
[facility] DEPT Provider Note   CSN: [id number] Arrival date & time: 11/13/16  1019     History   Chief Complaint Chief Complaint  Patient presents with  . Hypertension    HPI Robert Avila is a 36 y.o. male.  HPI   Pt with hx untreated hypertension, polycythemia vera with last phlebotomy 1.5 years ago p/w 1-2 weeks of not feeling well.  He is feeling fatigued and sleeping a lot.  Has had some headaches that are typical headaches for him.  Had swelling in his hands yesterday.  Has had other symptoms such as nasal congestion, chills, myalgias, nausea, single episode of dark stools.  Right great toe had ingrown toenail that had purulent discharge, is now red all over.  Denies sore throat, cough, CP, SOB, abdominal pain, vomiting, urinary symptoms.    Past Medical History:  Diagnosis Date  . Hypertension   . Polycythemia     Patient Active Problem List   Diagnosis Date Noted  . HTN (hypertension) 09/05/2014  . Lower back pain 09/04/2014  . Leg pain 09/04/2014  . Hip pain   . Polycythemia   . Myeloproliferative disorder ([place]) 06/01/2014  . Acute gouty arthritis 06/01/2014  . Sebaceous cyst 06/01/2014  . Polycythemia, secondary 05/27/2014  . Cellulitis of scrotum 05/24/2014    Past Surgical History:  Procedure Laterality Date  . DENTAL SURGERY         Home Medications    Prior to Admission medications   Medication Sig Start Date End Date Taking? Authorizing Provider  acetaminophen (TYLENOL) 500 MG tablet Take 1,000 mg by mouth every 6 (six) hours as needed (For pain.).    Yes Historical Provider, MD  aspirin 81 MG chewable tablet Chew 1 tablet (81 mg total) by mouth daily. 06/01/14  Yes Nishant Dhungel, MD  ibuprofen (ADVIL,MOTRIN) 200 MG tablet Take 800 mg by mouth every 6 (six) hours as needed (For pain.).    Yes Historical Provider, MD  amLODipine (NORVASC) 5 MG tablet Take 1 tablet (5 mg total) by mouth daily. Patient not taking: Reported on 08/01/2015  09/05/14   Oswald Hillock, MD  amLODipine (NORVASC) 5 MG tablet Take 1 tablet (5 mg total) by mouth daily. Patient not taking: Reported on 11/13/2016 08/01/15   Charlesetta Shanks, MD  omeprazole (PRILOSEC) 20 MG capsule Take 1 capsule (20 mg total) by mouth daily. Patient not taking: Reported on 11/13/2016 08/01/15   Charlesetta Shanks, MD    Family History Family History  Problem Relation Age of Onset  . Diabetes Father     Social History Social History  Substance Use Topics  . Smoking status: Never Smoker  . Smokeless tobacco: Never Used  . Alcohol use Yes     Comment: occ     Allergies   Adhesive [tape]   Review of Systems Review of Systems  All other systems reviewed and are negative.    Physical Exam Updated Vital Signs BP (!) 147/101 (BP Location: Right Arm)   Pulse (!) 111   Temp (!) 100.5 F (38.1 C) (Oral) Comment: RN aware  Resp 19   Ht 6\' 1"  (1.854 m)   Wt 124.7 kg   SpO2 92%   BMI 36.28 kg/m   Physical Exam  Constitutional: He appears well-developed and well-nourished. No distress.  HENT:  Head: Normocephalic and atraumatic.  Neck: Neck supple.  Cardiovascular: Normal rate and regular rhythm.   Pulmonary/Chest: Effort normal and breath sounds normal. No respiratory distress. He has no wheezes.  He has no rales.  Abdominal: Soft. He exhibits no distension and no mass. There is no tenderness. There is no rebound and no guarding.  Genitourinary: Rectum normal.  Genitourinary Comments: Yellow stool on glove  Musculoskeletal:  Right great toe erythematous, no active discharge around nail.  No fluctuance, skin edema, warmth, tenderness.    Neurological: He is alert. He exhibits normal muscle tone.  Skin: He is not diaphoretic.  Nursing note and vitals reviewed.    ED Treatments / Results  Labs (all labs ordered are listed, but only abnormal results are displayed) Labs Reviewed  BASIC METABOLIC PANEL - Abnormal; Notable for the following:       Result Value     Glucose, Bld 187 (*)    Calcium 8.6 (*)    All other components within normal limits  CBC WITH DIFFERENTIAL/PLATELET - Abnormal; Notable for the following:    WBC 17.6 (*)    RBC 9.96 (*)    Hemoglobin 20.3 (*)    HCT 63.3 (*)    MCV 63.7 (*)    MCH 20.4 (*)    RDW 20.5 (*)    Neutro Abs 15.6 (*)    Basophils Absolute 0.2 (*)    All other components within normal limits  POC OCCULT BLOOD, ED    EKG  EKG Interpretation None       Radiology No results found.  Procedures Procedures (including critical care time)  Medications Ordered in ED Medications  ondansetron (ZOFRAN) injection 4 mg (4 mg Intravenous Given 11/13/16 1237)  sodium chloride 0.9 % bolus 1,000 mL (0 mLs Intravenous Stopped 11/13/16 1404)     Initial Impression / Assessment and Plan / ED Course  I have reviewed the triage vital signs and the nursing notes.  Pertinent labs & imaging results that were available during my care of the patient were reviewed by me and considered in my medical decision making (see chart for details).  Clinical Course as of Nov 13 2228  Mon Nov 13, 2016  1721 I spoke with Dr Marin Olp who recomments phlebotomy - 500cc.  And needs close follow up with Dr Alvy Bimler, hematology.    [EW]  1756 IV team consulted for therapeutic phlebotomy  [EW]  1916 Pt reports immediately improvement in symptoms after phlebotomy.    [EW]    Clinical Course User Index [EW] Clayton Bibles, PA-C   Afebrile, nontoxic patient with hx polycythemia vera, not receiving treatment.  Primary symptoms is generalized fatigue, hypertension, typical chronic headaches.  Discussed patient's results with Dr Marin Olp who advised 500cc blood taken off, performed by IV team.  Pt strongly advised that he MUST follow up with hematologist, Dr Alvy Bimler, who diagnosed him.  Pt was also seen by case manager who attempted to help him get a primary care appointment.   D/C home with referrals/resources for follow up.  Discussed result,  findings, treatment, and follow up  with patient.  Pt given return precautions.  Pt verbalizes understanding and agrees with plan.       Final Clinical Impressions(s) / ED Diagnoses   Final diagnoses:  Polycythemia vera ([employer])  Hypertension, unspecified type    New Prescriptions Discharge Medication List as of 11/13/2016  7:18 PM       Clayton Bibles, PA-C 11/13/16 [place], MD 11/14/16 2150

## 2016-11-14 ENCOUNTER — Telehealth: Payer: Self-pay

## 2016-11-14 ENCOUNTER — Other Ambulatory Visit: Payer: Self-pay | Admitting: Hematology and Oncology

## 2016-11-14 ENCOUNTER — Telehealth: Payer: Self-pay | Admitting: Hematology and Oncology

## 2016-11-14 DIAGNOSIS — D471 Chronic myeloproliferative disease: Secondary | ICD-10-CM

## 2016-11-14 NOTE — Telephone Encounter (Signed)
Message received from Laurena Slimmer, RN CM requesting a hospital follow up appointment for the patient.  Call placed to the patient and he requested that I speak to his wife.  An appointment was then scheduled for 11/16/16 @ 1530. His wife stated that she knows where the clinic is as she is a patient of Dr Jerrye Bushy.  Update provided to Wendi Maya, RN CM

## 2016-11-14 NOTE — Telephone Encounter (Signed)
Called patient with appt times and date. Patient aware of new appts

## 2016-11-14 NOTE — Telephone Encounter (Signed)
I have already sent a message to scheduling

## 2016-11-14 NOTE — Telephone Encounter (Signed)
Patient was seen last night in the ER and was told to make an appointment with Dr Alvy Bimler as soon as possible.  Patient's call back # is [phone]  Message sent to POD and scheduler

## 2016-11-15 ENCOUNTER — Other Ambulatory Visit (HOSPITAL_BASED_OUTPATIENT_CLINIC_OR_DEPARTMENT_OTHER): Payer: Self-pay

## 2016-11-15 ENCOUNTER — Ambulatory Visit (HOSPITAL_BASED_OUTPATIENT_CLINIC_OR_DEPARTMENT_OTHER): Payer: Self-pay | Admitting: Hematology and Oncology

## 2016-11-15 ENCOUNTER — Other Ambulatory Visit: Payer: Self-pay | Admitting: Hematology and Oncology

## 2016-11-15 ENCOUNTER — Ambulatory Visit (HOSPITAL_BASED_OUTPATIENT_CLINIC_OR_DEPARTMENT_OTHER): Payer: Self-pay

## 2016-11-15 ENCOUNTER — Telehealth: Payer: Self-pay | Admitting: Hematology and Oncology

## 2016-11-15 ENCOUNTER — Encounter: Payer: Self-pay | Admitting: Hematology and Oncology

## 2016-11-15 DIAGNOSIS — D751 Secondary polycythemia: Secondary | ICD-10-CM

## 2016-11-15 DIAGNOSIS — D471 Chronic myeloproliferative disease: Secondary | ICD-10-CM

## 2016-11-15 DIAGNOSIS — Z9119 Patient's noncompliance with other medical treatment and regimen: Secondary | ICD-10-CM

## 2016-11-15 DIAGNOSIS — I158 Other secondary hypertension: Secondary | ICD-10-CM

## 2016-11-15 DIAGNOSIS — Z91199 Patient's noncompliance with other medical treatment and regimen due to unspecified reason: Secondary | ICD-10-CM | POA: Insufficient documentation

## 2016-11-15 LAB — CBC WITH DIFFERENTIAL/PLATELET
BASO%: 2.1 % — AB (ref 0.0–2.0)
Basophils Absolute: 0.3 10*3/uL — ABNORMAL HIGH (ref 0.0–0.1)
EOS%: 4.6 % (ref 0.0–7.0)
Eosinophils Absolute: 0.7 10*3/uL — ABNORMAL HIGH (ref 0.0–0.5)
HCT: 62.3 % — ABNORMAL HIGH (ref 38.4–49.9)
HEMOGLOBIN: 19.9 g/dL — AB (ref 13.0–17.1)
LYMPH#: 1.3 10*3/uL (ref 0.9–3.3)
LYMPH%: 8.6 % — ABNORMAL LOW (ref 14.0–49.0)
MCH: 20.3 pg — ABNORMAL LOW (ref 27.2–33.4)
MCHC: 31.9 g/dL — ABNORMAL LOW (ref 32.0–36.0)
MCV: 63.6 fL — ABNORMAL LOW (ref 79.3–98.0)
MONO#: 0.7 10*3/uL (ref 0.1–0.9)
MONO%: 4.2 % (ref 0.0–14.0)
NEUT%: 80.5 % — AB (ref 39.0–75.0)
NEUTROS ABS: 12.5 10*3/uL — AB (ref 1.5–6.5)
PLATELETS: 465 10*3/uL — AB (ref 140–400)
RBC: 9.8 10*6/uL — ABNORMAL HIGH (ref 4.20–5.82)
RDW: 21.3 % — AB (ref 11.0–14.6)
WBC: 15.5 10*3/uL — AB (ref 4.0–10.3)
nRBC: 0 % (ref 0–0)

## 2016-11-15 NOTE — Assessment & Plan Note (Signed)
The patient has Jak 2 positive myeloproliferative disorder Due to his young age, and no prior history of clotting, I recommend phlebotomy with aspirin therapy rather than starting him on hydroxyurea He will continue aspirin therapy

## 2016-11-15 NOTE — Progress Notes (Signed)
[place] OFFICE PROGRESS NOTE  Robert Avila Care Team: No Pcp Per Robert Avila as PCP - General (General Practice)  SUMMARY OF ONCOLOGIC HISTORY:  Robert Avila was found to have abnormal CBC on admission with leukocytosis, erythrocytosis but with normal platelet count on recent hospitalization. Polycythemia was suspected.   Robert Avila denies fever or chills, but does have frequent night sweats. Robert Avila has intermittent headaches. Robert Avila denies chest pain or shortness of breath, but had frequent leg cramps. 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 Denies a history of arthritis, kidney disease or gout. Robert Avila denies any vision changes. Robert Avila denies testosterone use. Was never diagnosed with congenital heart disease.  Robert Avila never suffer from diagnosis of blood clot but does have family history of stroke in his father and paternal uncle. Robert Avila does not take any aspirin products.There is no prior diagnosis of obstructive sleep apnea. The Robert Avila denies weight loss or pruritus.The Robert Avila is an occasional smoker consuming 1 cigar every 4 or 5 days, but does work in a [employer] and is exposed to second hand smoking by his wife.  On admission in October 2015, his CBC showed a Hb of 22.4, Hct of 71.4 with a WBC of 17.2. platelets were normal. The Robert Avila had numerous phlebotomy session while hospitalized recently. 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  Robert Avila was recommended regular phlebotomy and aspirin.  Robert Avila was subsequently lost to follow-up INTERVAL HISTORY: Please see below for problem oriented charting. Robert Avila is seen urgently today because Robert Avila presented to the hospital recently with symptoms of excessive fatigue.  CBC from emergency department revealed hemoglobin of 20.  Robert Avila received 1 unit of phlebotomy treatment. His symptoms improved with phlebotomy. Robert Avila denies chest pain  or headaches.  Robert Avila complained of some leg cramps  REVIEW OF SYSTEMS:   Constitutional: Denies fevers,  chills or abnormal weight loss Eyes: Denies blurriness of vision Ears, nose, mouth, throat, and face: Denies mucositis or sore throat Respiratory: Denies cough, dyspnea or wheezes Gastrointestinal:  Denies nausea, heartburn or change in bowel habits Skin: Denies abnormal skin rashes Lymphatics: Denies new lymphadenopathy or easy bruising Neurological:Denies numbness, tingling or new weaknesses Behavioral/Psych: Mood is stable, no new changes  All other systems were reviewed with the Robert Avila and are negative.  I have reviewed the past medical history, past surgical history, social history and family history with the Robert Avila and they are unchanged from previous note.  ALLERGIES:  is allergic to adhesive [tape].  MEDICATIONS:  Current Outpatient Prescriptions  Medication Sig Dispense Refill  . acetaminophen (TYLENOL) 500 MG tablet Take 1,000 mg by mouth every 6 (six) hours as needed (For pain.).     Marland Kitchen aspirin 81 MG chewable tablet Chew 1 tablet (81 mg total) by mouth daily. 30 tablet 3  . ibuprofen (ADVIL,MOTRIN) 200 MG tablet Take 800 mg by mouth every 6 (six) hours as needed (For pain.).     Marland Kitchen Melatonin 5 MG CAPS Take by mouth.     No current facility-administered medications for this visit.     PHYSICAL EXAMINATION: ECOG PERFORMANCE STATUS: 1 - Symptomatic but completely ambulatory  Vitals:   11/15/16 1113  BP: (!) 144/94  Pulse: 100  Resp: 20  Temp: 98.4 F (36.9 C)   Filed Weights   11/15/16 1113  Weight: 260 lb 1.6 oz (118 kg)    GENERAL:alert, no distress and comfortable SKIN: skin color, texture, turgor are normal, no rashes or significant lesions.  Robert Avila  appears plethoric EYES: normal, Conjunctiva are pink and non-injected, sclera clear OROPHARYNX:no exudate, no erythema and lips, buccal mucosa, and tongue normal  NECK: supple, thyroid normal size, non-tender, without nodularity LYMPH:  no palpable lymphadenopathy in the cervical, axillary or inguinal LUNGS: clear  to auscultation and percussion with normal breathing effort HEART: regular rate & rhythm and no murmurs and no lower extremity edema ABDOMEN:abdomen soft, non-tender and normal bowel sounds Musculoskeletal:no cyanosis of digits and no clubbing  NEURO: alert & oriented x 3 with fluent speech, no focal motor/sensory deficits  LABORATORY DATA:  I have reviewed the data as listed    Component Value Date/Time   NA 137 11/13/2016 1227   K 4.2 11/13/2016 1227   CL 103 11/13/2016 1227   CO2 25 11/13/2016 1227   GLUCOSE 187 (H) 11/13/2016 1227   BUN 11 11/13/2016 1227   CREATININE 1.08 11/13/2016 1227   CALCIUM 8.6 (L) 11/13/2016 1227   PROT 7.1 09/05/2014 0641   ALBUMIN 3.4 (L) 09/05/2014 0641   AST 17 09/05/2014 0641   ALT 19 09/05/2014 0641   ALKPHOS 141 (H) 09/05/2014 0641   BILITOT 1.0 09/05/2014 0641   GFRNONAA >60 11/13/2016 1227   GFRAA >60 11/13/2016 1227    No results found for: SPEP, UPEP  Lab Results  Component Value Date   WBC 15.5 (H) 11/15/2016   NEUTROABS 12.5 (H) 11/15/2016   HGB 19.9 (H) 11/15/2016   HCT 62.3 (H) 11/15/2016   MCV 63.6 (L) 11/15/2016   PLT 465 (H) 11/15/2016      Chemistry      Component Value Date/Time   NA 137 11/13/2016 1227   K 4.2 11/13/2016 1227   CL 103 11/13/2016 1227   CO2 25 11/13/2016 1227   BUN 11 11/13/2016 1227   CREATININE 1.08 11/13/2016 1227      Component Value Date/Time   CALCIUM 8.6 (L) 11/13/2016 1227   ALKPHOS 141 (H) 09/05/2014 0641   AST 17 09/05/2014 0641   ALT 19 09/05/2014 0641   BILITOT 1.0 09/05/2014 0641       ASSESSMENT & PLAN:  Polycythemia, secondary The Robert Avila has high risk polycythemia with significant symptoms which suggested high risk of clotting. We discussed extensively the importance of compliance to treatment recommendation I recommend daily phlebotomy for the next 3 days and the following week. I will see him back next week to see if Robert Avila responded to phlebotomy or not We discussed  inpatient hospitalization versus outpatient treatment. 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 Robert Avila is unlikely going to need long-term blood pressure medications if phlebotomy helps reduced his hematocrit  Non compliance with medical treatment The Robert Avila is medically noncompliant and was lost to follow-up for almost 2 years Robert Avila presented to the emergency department recently with dangerously high hematocrit I warned him about potential risk of thrombosis, heart attack and stroke if Robert Avila proceed without phlebotomy. We discussed the importance of compliance with treatment recommendation. Robert Avila agreed to come back on the regular basis in the future   No orders of  the defined types were placed in this encounter.  All questions were answered. The Robert Avila knows to call the clinic with any problems, questions or concerns. No barriers to learning was detected. I spent 30 minutes counseling the Robert Avila face to face. The total time spent in the appointment was 40 minutes and more than 50% was on counseling and review of test results     Heath Lark, MD 11/15/2016 1:13 PM

## 2016-11-15 NOTE — Assessment & Plan Note (Signed)
The patient has high risk polycythemia with significant symptoms which suggested high risk of clotting. We discussed extensively the importance of compliance to treatment recommendation I recommend daily phlebotomy for the next 3 days and the following week. I will see him back next week to see if he responded to phlebotomy or not We discussed inpatient hospitalization versus outpatient treatment. The patient desire outpatient treatment for now I will hold off starting him on hydroxyurea pending success from phlebotomy He will continue aspirin therapy

## 2016-11-15 NOTE — Patient Instructions (Signed)
Therapeutic Phlebotomy Therapeutic phlebotomy is the controlled removal of blood from a person's body for the purpose of treating a medical condition. The procedure is similar to donating blood. Usually, about a pint (470 mL, or 0.47L) of blood is removed. The average adult has 9-12 pints (4.3-5.7 L) of blood. Therapeutic phlebotomy may be used to treat the following medical conditions:  Hemochromatosis. This is a condition in which the blood contains too much iron.  Polycythemia vera. This is a condition in which the blood contains too many red blood cells.  Porphyria cutanea tarda. This is a disease in which an important part of hemoglobin is not made properly. It results in the buildup of abnormal amounts of porphyrins in the body.  Sickle cell disease. This is a condition in which the red blood cells form an abnormal crescent shape rather than a round shape. Tell a health care provider about:  Any allergies you have.  All medicines you are taking, including vitamins, herbs, eye drops, creams, and over-the-counter medicines.  Any problems you or family members have had with anesthetic medicines.  Any blood disorders you have.  Any surgeries you have had.  Any medical conditions you have. What are the risks? Generally, this is a safe procedure. However, problems may occur, including:  Nausea or light-headedness.  Low blood pressure.  Soreness, bleeding, swelling, or bruising at the needle insertion site.  Infection. What happens before the procedure?  Follow instructions from your health care provider about eating or drinking restrictions.  Ask your health care provider about changing or stopping your regular medicines. This is especially important if you are taking diabetes medicines or blood thinners.  Wear clothing with sleeves that can be raised above the elbow.  Plan to have someone take you home after the procedure.  You may have a blood sample taken. What happens  during the procedure?  A needle will be inserted into one of your veins.  Tubing and a collection bag will be attached to that needle.  Blood will flow through the needle and tubing into the collection bag.  You may be asked to open and close your hand slowly and continually during the entire collection.  After the specified amount of blood has been removed from your body, the collection bag and tubing will be clamped.  The needle will be removed from your vein.  Pressure will be held on the site of the needle insertion to stop the bleeding.  A bandage (dressing) will be placed over the needle insertion site. The procedure may vary among health care providers and hospitals. What happens after the procedure?  Your recovery will be assessed and monitored.  You can return to your normal activities as directed by your health care provider. This information is not intended to replace advice given to you by your health care provider. Make sure you discuss any questions you have with your health care provider. Document Released: 12/26/2010 Document Revised: 03/25/2016 Document Reviewed: 07/20/2014 Elsevier Interactive Patient Education  2017 [employer]  

## 2016-11-15 NOTE — Assessment & Plan Note (Signed)
The hypertension is likely induced by polycythemia For now, I recommend phlebotomy and hydration therapy. He is unlikely going to need long-term blood pressure medications if phlebotomy helps reduced his hematocrit

## 2016-11-15 NOTE — Telephone Encounter (Signed)
Phlebotomy scheduled for Thursday, Friday and Monday, per 11/15/16 los. Patient was given a copy of the AVS report and appointment schedule,per 11/15/16 los.

## 2016-11-15 NOTE — Assessment & Plan Note (Signed)
The patient is medically noncompliant and was lost to follow-up for almost 2 years He presented to the emergency department recently with dangerously high hematocrit I warned him about potential risk of thrombosis, heart attack and stroke if he proceed without phlebotomy. We discussed the importance of compliance with treatment recommendation. He agreed to come back on the regular basis in the future

## 2016-11-16 ENCOUNTER — Ambulatory Visit: Payer: Self-pay | Attending: Internal Medicine | Admitting: Physician Assistant

## 2016-11-16 ENCOUNTER — Other Ambulatory Visit: Payer: Self-pay

## 2016-11-16 ENCOUNTER — Ambulatory Visit: Payer: Self-pay | Admitting: Hematology and Oncology

## 2016-11-16 ENCOUNTER — Ambulatory Visit (HOSPITAL_BASED_OUTPATIENT_CLINIC_OR_DEPARTMENT_OTHER): Payer: Self-pay

## 2016-11-16 VITALS — BP 134/96 | HR 103 | Temp 97.6°F | Resp 16 | Wt 257.6 lb

## 2016-11-16 VITALS — BP 128/81 | HR 104 | Temp 98.1°F | Resp 18

## 2016-11-16 DIAGNOSIS — Z833 Family history of diabetes mellitus: Secondary | ICD-10-CM | POA: Insufficient documentation

## 2016-11-16 DIAGNOSIS — Z888 Allergy status to other drugs, medicaments and biological substances status: Secondary | ICD-10-CM | POA: Insufficient documentation

## 2016-11-16 DIAGNOSIS — E118 Type 2 diabetes mellitus with unspecified complications: Secondary | ICD-10-CM

## 2016-11-16 DIAGNOSIS — Z8249 Family history of ischemic heart disease and other diseases of the circulatory system: Secondary | ICD-10-CM | POA: Insufficient documentation

## 2016-11-16 DIAGNOSIS — I10 Essential (primary) hypertension: Secondary | ICD-10-CM | POA: Insufficient documentation

## 2016-11-16 DIAGNOSIS — D471 Chronic myeloproliferative disease: Secondary | ICD-10-CM

## 2016-11-16 DIAGNOSIS — R739 Hyperglycemia, unspecified: Secondary | ICD-10-CM

## 2016-11-16 DIAGNOSIS — D751 Secondary polycythemia: Secondary | ICD-10-CM | POA: Insufficient documentation

## 2016-11-16 DIAGNOSIS — E119 Type 2 diabetes mellitus without complications: Secondary | ICD-10-CM | POA: Insufficient documentation

## 2016-11-16 DIAGNOSIS — Z79899 Other long term (current) drug therapy: Secondary | ICD-10-CM | POA: Insufficient documentation

## 2016-11-16 DIAGNOSIS — Z7982 Long term (current) use of aspirin: Secondary | ICD-10-CM | POA: Insufficient documentation

## 2016-11-16 DIAGNOSIS — L600 Ingrowing nail: Secondary | ICD-10-CM | POA: Insufficient documentation

## 2016-11-16 DIAGNOSIS — E1165 Type 2 diabetes mellitus with hyperglycemia: Secondary | ICD-10-CM | POA: Insufficient documentation

## 2016-11-16 DIAGNOSIS — Z7984 Long term (current) use of oral hypoglycemic drugs: Secondary | ICD-10-CM | POA: Insufficient documentation

## 2016-11-16 LAB — GLUCOSE, POCT (MANUAL RESULT ENTRY): POC Glucose: 262 mg/dl — AB (ref 70–99)

## 2016-11-16 LAB — POCT GLYCOSYLATED HEMOGLOBIN (HGB A1C): Hemoglobin A1C: 9.9

## 2016-11-16 MED ORDER — LISINOPRIL 5 MG PO TABS: 5.0000 mg | ORAL_TABLET | Freq: Every day | ORAL | 3 refills | Status: DC

## 2016-11-16 MED ORDER — TRUEPLUS LANCETS 28G MISC: 1 refills | Status: AC

## 2016-11-16 MED ORDER — TRUE METRIX METER W/DEVICE KIT: PACK | 0 refills | Status: AC

## 2016-11-16 MED ORDER — GLUCOSE BLOOD VI STRP: ORAL_STRIP | 12 refills | Status: AC

## 2016-11-16 MED ORDER — CEPHALEXIN 500 MG PO CAPS
500.0000 mg | ORAL_CAPSULE | Freq: Three times a day (TID) | ORAL | 0 refills | Status: DC
Start: 2016-11-16 — End: 2017-01-08

## 2016-11-16 MED ORDER — METFORMIN HCL 1000 MG PO TABS
1000.0000 mg | ORAL_TABLET | Freq: Two times a day (BID) | ORAL | 3 refills | Status: DC
Start: 2016-11-16 — End: 2017-01-08

## 2016-11-16 MED FILL — TRUEplus LANCETS 28G MISC: 30 days supply | Qty: 100 | Fill #0

## 2016-11-16 MED FILL — LISINOPRIL 5 MG TAB: 5 | 30 days supply | Qty: 30 | Fill #0

## 2016-11-16 MED FILL — ?METFORMIN HCL 1,000 MG TAB: 1000 | 30 days supply | Qty: 60 | Fill #0

## 2016-11-16 MED FILL — TRUE METRIX TEST STRIP: 30 days supply | Qty: 100 | Fill #0

## 2016-11-16 MED FILL — CEPHALEXIN 500 MG CAPSULE: 500 | 10 days supply | Qty: 30 | Fill #0

## 2016-11-16 MED FILL — !TRUE METRIX BLOOD GLUCOSE: 365 days supply | Qty: 1 | Fill #0

## 2016-11-16 NOTE — Patient Instructions (Addendum)
Check BP out of the office daily and record and bring to next visit.  Check blood sugars fasting and and bedtime and record and bring to next visit.  Make sure you are drinking at least 100 ounces of water daily.  Start to cut out sugars and white carbohydrates from your diet.   Polycythemia Vera Polycythemia vera (PV), or myeloproliferative disease, is a form of blood cancer in which the bone marrow makes too many (overproduces) red blood cells. The bone marrow may also make too many clotting cells (platelets) and white blood cells. Bone marrow is the spongy center of bones where blood cells are produced. Sometimes, there may be an overproduction of blood cells in the liver and spleen, causing those organs to become enlarged. Additionally, people who have PV are at a higher risk for stroke or heart attack because their blood may clot more easily. PV is a long-term disease. What are the causes? Almost all people who have PV have an abnormal gene (genetic mutation) that causes changes in the way that the bone marrow makes blood cells. This gene, which is called JAK2, is not passed along from parent to child (is not hereditary). It is not known what triggers the genetic mutation that causes the body to produce too many red blood cells. What increases the risk? This condition is more likely to develop in:  Males.  People who are 83 years of age or older. What are the signs or symptoms? You may not have any symptoms in the early stage of PV. When symptoms develop, they may include:  Shortness of breath.  Dizziness.  Hot and flushed skin.  Itchy skin.  Sweats, especially night sweats.  Headache.  Tiredness.  Ringing in the ears.  Blurred vision or blind spots.  Bone pain.  Weight loss.  Fever.  Blood-tinged vomit or bowel movements. How is this diagnosed? This condition may be diagnosed during a routine physical exam if you have a blood test called a complete blood count (CBC).  Your health care provider also may suspect PV if you have symptoms. During the physical exam, your provider may find that you have an enlarged liver or spleen. You may also have tests to confirm the diagnosis. These may include:  A procedure to remove a sample of bone marrow for testing (bone marrow biopsy).  Blood tests to check for:  The JAK2 gene.  Low levels of a hormone that helps to regulate blood production (erythropoietin). How is this treated? There is no cure for PV, but treatment can help to control the disease. There are several types of treatment. No single treatment works for everyone. You will need to work with a blood cancer specialist (hematologist) to find the treatment that is best for you. Options include:  Periodically having some blood removed with a needle (drawn) to lower the number of red blood cells (phlebotomy).  Medicine. Your health care provider may recommend:  Low-dose aspirin to lower your risk for blood clots.  A medicine to reduce red blood cell production (hydroxyurea).  A medicine to lower the number of red blood cells (interferon).  A medicine that slows down the effects of JAK2 (ruxolitinib). Follow these instructions at home:  Take over-the-counter and prescription medicines only as told by your health care provider.  Return to your normal activities as told by your health care provider. Ask your health care provider what activities are safe for you.  Do not use tobacco products, including cigarettes, chewing tobacco, or  e-cigarettes. If you need help quitting, ask your health care provider.  Keep all follow-up visits as told by your health care provider. This is important. Contact a health care provider if:  You have side effects from your medicines.  Your symptoms change or get worse at home.  You have blood in your stool or you vomit blood. Get help right away if:  You have sudden and severe pain in your abdomen.  You have chest  pain or difficulty breathing.  You have signs of stroke, such as:  Sudden numbness.  Weakness of your face or arm.  Confusion.  Difficulty speaking or understanding speech. These symptoms may represent a serious problem that is an emergency. Do not wait to see if the symptoms will go away. Get medical help right away. Call your local emergency services (911 in the [place] Do not drive yourself to the hospital. This information is not intended to replace advice given to you by your health care provider. Make sure you discuss any questions you have with your health care provider. Document Released: 04/18/2001 Document Revised: 12/30/2015 Document Reviewed: 02/03/2015 Elsevier Interactive Patient Education  2017 [employer].  Hypertension Hypertension, commonly called high blood pressure, is when the force of blood pumping through the arteries is too strong. The arteries are the blood vessels that carry blood from the heart throughout the body. Hypertension forces the heart to work harder to pump blood and may cause arteries to become narrow or stiff. Having untreated or uncontrolled hypertension can cause heart attacks, strokes, kidney disease, and other problems. A blood pressure reading consists of a higher number over a lower number. Ideally, your blood pressure should be below 120/80. The first ("top") number is called the systolic pressure. It is a measure of the pressure in your arteries as your heart beats. The second ("bottom") number is called the diastolic pressure. It is a measure of the pressure in your arteries as the heart relaxes. What are the causes? The cause of this condition is not known. What increases the risk? Some risk factors for high blood pressure are under your control. Others are not. Factors you can change   Smoking.  Having type 2 diabetes mellitus, high cholesterol, or both.  Not getting enough exercise or physical activity.  Being overweight.  Having  too much fat, sugar, calories, or salt (sodium) in your diet.  Drinking too much alcohol. Factors that are difficult or impossible to change   Having chronic kidney disease.  Having a family history of high blood pressure.  Age. Risk increases with age.  Race. You may be at higher risk if you are African-American.  Gender. Men are at higher risk than women before age 52. After age 45, women are at higher risk than men.  Having obstructive sleep apnea.  Stress. What are the signs or symptoms? Extremely high blood pressure (hypertensive crisis) may cause:  Headache.  Anxiety.  Shortness of breath.  Nosebleed.  Nausea and vomiting.  Severe chest pain.  Jerky movements you cannot control (seizures). How is this diagnosed? This condition is diagnosed by measuring your blood pressure while you are seated, with your arm resting on a surface. The cuff of the blood pressure monitor will be placed directly against the skin of your upper arm at the level of your heart. It should be measured at least twice using the same arm. Certain conditions can cause a difference in blood pressure between your right and left arms. Certain factors can cause  blood pressure readings to be lower or higher than normal (elevated) for a short period of time:  When your blood pressure is higher when you are in a health care provider's office than when you are at home, this is called white coat hypertension. Most people with this condition do not need medicines.  When your blood pressure is higher at home than when you are in a health care provider's office, this is called masked hypertension. Most people with this condition may need medicines to control blood pressure. If you have a high blood pressure reading during one visit or you have normal blood pressure with other risk factors:  You may be asked to return on a different day to have your blood pressure checked again.  You may be asked to monitor  your blood pressure at home for 1 week or longer. If you are diagnosed with hypertension, you may have other blood or imaging tests to help your health care provider understand your overall risk for other conditions. How is this treated? This condition is treated by making healthy lifestyle changes, such as eating healthy foods, exercising more, and reducing your alcohol intake. Your health care provider may prescribe medicine if lifestyle changes are not enough to get your blood pressure under control, and if:  Your systolic blood pressure is above 130.  Your diastolic blood pressure is above 80. Your personal target blood pressure may vary depending on your medical conditions, your age, and other factors. Follow these instructions at home: Eating and drinking   Eat a diet that is high in fiber and potassium, and low in sodium, added sugar, and fat. An example eating plan is called the DASH (Dietary Approaches to Stop Hypertension) diet. To eat this way:  Eat plenty of fresh fruits and vegetables. Try to fill half of your plate at each meal with fruits and vegetables.  Eat whole grains, such as whole wheat pasta, brown rice, or whole grain bread. Fill about one quarter of your plate with whole grains.  Eat or drink low-fat dairy products, such as skim milk or low-fat yogurt.  Avoid fatty cuts of meat, processed or cured meats, and poultry with skin. Fill about one quarter of your plate with lean proteins, such as fish, chicken without skin, beans, eggs, and tofu.  Avoid premade and processed foods. These tend to be higher in sodium, added sugar, and fat.  Reduce your daily sodium intake. Most people with hypertension should eat less than 1,500 mg of sodium a day.  Limit alcohol intake to no more than 1 drink a day for nonpregnant women and 2 drinks a day for men. One drink equals 12 oz of beer, 5 oz of wine, or 1 oz of hard liquor. Lifestyle   Work with your health care provider to  maintain a healthy body weight or to lose weight. Ask what an ideal weight is for you.  Get at least 30 minutes of exercise that causes your heart to beat faster (aerobic exercise) most days of the week. Activities may include walking, swimming, or biking.  Include exercise to strengthen your muscles (resistance exercise), such as pilates or lifting weights, as part of your weekly exercise routine. Try to do these types of exercises for 30 minutes at least 3 days a week.  Do not use any products that contain nicotine or tobacco, such as cigarettes and e-cigarettes. If you need help quitting, ask your health care provider.  Monitor your blood pressure at home as  told by your health care provider.  Keep all follow-up visits as told by your health care provider. This is important. Medicines   Take over-the-counter and prescription medicines only as told by your health care provider. Follow directions carefully. Blood pressure medicines must be taken as prescribed.  Do not skip doses of blood pressure medicine. Doing this puts you at risk for problems and can make the medicine less effective.  Ask your health care provider about side effects or reactions to medicines that you should watch for. Contact a health care provider if:  You think you are having a reaction to a medicine you are taking.  You have headaches that keep coming back (recurring).  You feel dizzy.  You have swelling in your ankles.  You have trouble with your vision. Get help right away if:  You develop a severe headache or confusion.  You have unusual weakness or numbness.  You feel faint.  You have severe pain in your chest or abdomen.  You vomit repeatedly.  You have trouble breathing. Summary  Hypertension is when the force of blood pumping through your arteries is too strong. If this condition is not controlled, it may put you at risk for serious complications.  Your personal target blood pressure may  vary depending on your medical conditions, your age, and other factors. For most people, a normal blood pressure is less than 120/80.  Hypertension is treated with lifestyle changes, medicines, or a combination of both. Lifestyle changes include weight loss, eating a healthy, low-sodium diet, exercising more, and limiting alcohol. This information is not intended to replace advice given to you by your health care provider. Make sure you discuss any questions you have with your health care provider. Document Released: 07/24/2005 Document Revised: 06/21/2016 Document Reviewed: 06/21/2016 Elsevier Interactive Patient Education  2017 [employer] for Diabetes Mellitus, Adult Carbohydrate counting is a method for keeping track of how many carbohydrates you eat. Eating carbohydrates naturally increases the amount of sugar (glucose) in the blood. Counting how many carbohydrates you eat helps keep your blood glucose within normal limits, which helps you manage your diabetes (diabetes mellitus). It is important to know how many carbohydrates you can safely have in each meal. This is different for every person. A diet and nutrition specialist (registered dietitian) can help you make a meal plan and calculate how many carbohydrates you should have at each meal and snack. Carbohydrates are found in the following foods: Grains, such as breads and cereals. Dried beans and soy products. Starchy vegetables, such as potatoes, peas, and corn. Fruit and fruit juices. Milk and yogurt. Sweets and snack foods, such as cake, cookies, candy, chips, and soft drinks. How do I count carbohydrates? There are two ways to count carbohydrates in food. You can use either of the methods or a combination of both. Reading "Nutrition Facts" on packaged food  The "Nutrition Facts" list is included on the labels of almost all packaged foods and beverages in the [place] It includes: The serving  size. Information about nutrients in each serving, including the grams (g) of carbohydrate per serving. To use the "Nutrition Facts": Decide how many servings you will have. Multiply the number of servings by the number of carbohydrates per serving. The resulting number is the total amount of carbohydrates that you will be having. Learning standard serving sizes of other foods  When you eat foods containing carbohydrates that are not packaged or do not include "Nutrition Facts" on the  label, you need to measure the servings in order to count the amount of carbohydrates: Measure the foods that you will eat with a food scale or measuring cup, if needed. Decide how many standard-size servings you will eat. Multiply the number of servings by 15. Most carbohydrate-rich foods have about 15 g of carbohydrates per serving. For example, if you eat 8 oz (170 g) of strawberries, you will have eaten 2 servings and 30 g of carbohydrates (2 servings x 15 g = 30 g). For foods that have more than one food mixed, such as soups and casseroles, you must count the carbohydrates in each food that is included. The following list contains standard serving sizes of common carbohydrate-rich foods. Each of these servings has about 15 g of carbohydrates:  hamburger bun or  English muffin.  oz (15 mL) syrup.  oz (14 g) jelly. 1 slice of bread. 1 six-inch tortilla. 3 oz (85 g) cooked rice or pasta. 4 oz (113 g) cooked dried beans. 4 oz (113 g) starchy vegetable, such as peas, corn, or potatoes. 4 oz (113 g) hot cereal. 4 oz (113 g) mashed potatoes or  of a large baked potato. 4 oz (113 g) canned or frozen fruit. 4 oz (120 mL) fruit juice. 4-6 crackers. 6 chicken nuggets. 6 oz (170 g) unsweetened dry cereal. 6 oz (170 g) plain fat-free yogurt or yogurt sweetened with artificial sweeteners. 8 oz (240 mL) milk. 8 oz (170 g) fresh fruit or one small piece of fruit. 24 oz (680 g) popped popcorn. Example of  carbohydrate counting Sample meal  3 oz (85 g) chicken breast. 6 oz (170 g) brown rice. 4 oz (113 g) corn. 8 oz (240 mL) milk. 8 oz (170 g) strawberries with sugar-free whipped topping. Carbohydrate calculation  Identify the foods that contain carbohydrates: Rice. Corn. Milk. Strawberries. Calculate how many servings you have of each food: 2 servings rice. 1 serving corn. 1 serving milk. 1 serving strawberries. Multiply each number of servings by 15 g: 2 servings rice x 15 g = 30 g. 1 serving corn x 15 g = 15 g. 1 serving milk x 15 g = 15 g. 1 serving strawberries x 15 g = 15 g. Add together all of the amounts to find the total grams of carbohydrates eaten: 30 g + 15 g + 15 g + 15 g = 75 g of carbohydrates total. This information is not intended to replace advice given to you by your health care provider. Make sure you discuss any questions you have with your health care provider. Document Released: 07/24/2005 Document Revised: 02/11/2016 Document Reviewed: 01/05/2016 Elsevier Interactive Patient Education  2017 [employer].

## 2016-11-16 NOTE — Patient Instructions (Signed)
Therapeutic Phlebotomy Therapeutic phlebotomy is the controlled removal of blood from a person's body for the purpose of treating a medical condition. The procedure is similar to donating blood. Usually, about a pint (470 mL, or 0.47L) of blood is removed. The average adult has 9-12 pints (4.3-5.7 L) of blood. Therapeutic phlebotomy may be used to treat the following medical conditions:  Hemochromatosis. This is a condition in which the blood contains too much iron.  Polycythemia vera. This is a condition in which the blood contains too many red blood cells.  Porphyria cutanea tarda. This is a disease in which an important part of hemoglobin is not made properly. It results in the buildup of abnormal amounts of porphyrins in the body.  Sickle cell disease. This is a condition in which the red blood cells form an abnormal crescent shape rather than a round shape. Tell a health care provider about:  Any allergies you have.  All medicines you are taking, including vitamins, herbs, eye drops, creams, and over-the-counter medicines.  Any problems you or family members have had with anesthetic medicines.  Any blood disorders you have.  Any surgeries you have had.  Any medical conditions you have. What are the risks? Generally, this is a safe procedure. However, problems may occur, including:  Nausea or light-headedness.  Low blood pressure.  Soreness, bleeding, swelling, or bruising at the needle insertion site.  Infection. What happens before the procedure?  Follow instructions from your health care provider about eating or drinking restrictions.  Ask your health care provider about changing or stopping your regular medicines. This is especially important if you are taking diabetes medicines or blood thinners.  Wear clothing with sleeves that can be raised above the elbow.  Plan to have someone take you home after the procedure.  You may have a blood sample taken. What happens  during the procedure?  A needle will be inserted into one of your veins.  Tubing and a collection bag will be attached to that needle.  Blood will flow through the needle and tubing into the collection bag.  You may be asked to open and close your hand slowly and continually during the entire collection.  After the specified amount of blood has been removed from your body, the collection bag and tubing will be clamped.  The needle will be removed from your vein.  Pressure will be held on the site of the needle insertion to stop the bleeding.  A bandage (dressing) will be placed over the needle insertion site. The procedure may vary among health care providers and hospitals. What happens after the procedure?  Your recovery will be assessed and monitored.  You can return to your normal activities as directed by your health care provider. This information is not intended to replace advice given to you by your health care provider. Make sure you discuss any questions you have with your health care provider. Document Released: 12/26/2010 Document Revised: 03/25/2016 Document Reviewed: 07/20/2014 Elsevier Interactive Patient Education  2017 [employer]  

## 2016-11-16 NOTE — Progress Notes (Signed)
1 unit therapeutic phlebotomy performed using an 18 gauge IV catheter over 25 minutes. Patient tolerated well. Nourishment provided.     Patient observed 30 minutes post phlebotomy and voiced no complaints. VSS stable. Patient discharged ambulatory.

## 2016-11-16 NOTE — Progress Notes (Signed)
Robert Avila, is a 36 y.o. male  YSH:[id number]  [id number]  DOB - [birth year]  Subjective:  Chief Complaint and HPI: Robert Avila is a 36 y.o. male here today to establish care and for a follow up visit after being seen in the ED 11/13/2016 for fatigue, elevated BP, and increased sleep. H/O polycythemia without follow-up and Hgb was found to be 20.3.  Phlebotomy was performed and he has been having phlebotomy daily with Dr Elson Areas.  His BP is supposed to be followed and was hopefully expected to trend downward as Hgb improved-  Today he presents with continued fatigue.    He did not know that his blood sugar was elevated.  He denies S/Sx of hyper/hypoglycemia.  Also c/o R ingrown nail for a couple of weeks.  The last couple of days, he has been able to express some purulent drainage from it.  ED/Hospital notes reviewed.   Social History: married and has young children Family history:  Dad with DM; htn  ROS:   Constitutional:  No f/c, No night sweats, No unexplained weight loss. EENT:  No vision changes, No blurry vision, No hearing changes. No mouth, throat, or ear problems.  Respiratory: No cough, No SOB Cardiac: No CP, no palpitations GI:  No abd pain, No N/V/D. GU: No Urinary s/sx Musculoskeletal: No joint pain Neuro: No headache, no dizziness, no motor weakness.  Skin: No rash Endocrine:  No polydipsia. No polyuria.  Psych: Denies SI/HI  Problem  Diabetes ([facility])    ALLERGIES: Allergies  Allergen Reactions  . Adhesive [Tape] Rash    PAST MEDICAL HISTORY: Past Medical History:  Diagnosis Date  . Hypertension   . Polycythemia     MEDICATIONS AT HOME: Prior to Admission medications   Medication Sig Start Date End Date Taking? Authorizing Provider  acetaminophen (TYLENOL) 500 MG tablet Take 1,000 mg by mouth every 6 (six) hours as needed (For pain.).     Historical Provider, MD  aspirin 81 MG chewable tablet Chew 1 tablet (81 mg total) by mouth daily. 06/01/14    Nishant Dhungel, MD  Blood Glucose Monitoring Suppl (TRUE METRIX METER) w/Device KIT Check blood sugars fasting and at bedtime and record 11/16/16   Argentina Donovan, PA-C  cephALEXin (KEFLEX) 500 MG capsule Take 1 capsule (500 mg total) by mouth 3 (three) times daily. 11/16/16   Argentina Donovan, PA-C  glucose blood (TRUE METRIX BLOOD GLUCOSE TEST) test strip Use as instructed 11/16/16   Argentina Donovan, PA-C  ibuprofen (ADVIL,MOTRIN) 200 MG tablet Take 800 mg by mouth every 6 (six) hours as needed (For pain.).     Historical Provider, MD  lisinopril (PRINIVIL,ZESTRIL) 5 MG tablet Take 1 tablet (5 mg total) by mouth daily. 11/16/16   Argentina Donovan, PA-C  Melatonin 5 MG CAPS Take by mouth.    Historical Provider, MD  metFORMIN (GLUCOPHAGE) 1000 MG tablet Take 1 tablet (1,000 mg total) by mouth 2 (two) times daily with a meal. 11/16/16   Argentina Donovan, PA-C  TRUEPLUS LANCETS 28G MISC Check blood sugars fasting and at bedtime and record 11/16/16   Argentina Donovan, PA-C     Objective:  EXAM:   Vitals:   11/16/16 1535  BP: (!) 134/96  Pulse: (!) 103  Resp: 16  Temp: 97.6 F (36.4 C)  TempSrc: Oral  SpO2: 95%  Weight: 257 lb 9.6 oz (116.8 kg)    General appearance : A&OX3. NAD. Non-toxic-appearing HEENT: Atraumatic and Normocephalic.  PERRLA. EOM intact.  TM clear B. Mouth-MMM, post pharynx WNL w/o erythema, No PND. Neck: supple, no JVD. No cervical lymphadenopathy. No thyromegaly Chest/Lungs:  Breathing-non-labored, Good air entry bilaterally, breath sounds normal without rales, rhonchi, or wheezing  CVS: S1 S2 regular, no murmurs, gallops, rubs  Extremities: Bilateral Lower Ext shows no edema, both legs are warm to touch with = pulse throughout R great toe with erythema and swelling around the cuticle area/paronychia type appearance.  N-V intact.  Neurology:  CN II-XII grossly intact, Non focal.   Psych:  TP linear. J/I WNL. Normal speech. Appropriate eye contact and affect.    Skin:  No Rash  Data Review Lab Results  Component Value Date   HGBA1C 9.9 11/16/2016   HGBA1C 11.1 (H) 08/01/2015     Assessment & Plan   1. Hyperglycemia(Diabetes) See #3(unable to replace this as a diagnosis because already associated) - POCT glycosylated hemoglobin (Hb A1C) - POCT glucose (manual entry)  2. Polycythemia, secondary Continue phlebotomy as directed and keep all f/up with Dr. Elson Areas.  3. Type 2 diabetes mellitus with complication, without long-term current use of insulin ([place]) New diagnosis start- metFORMIN (GLUCOPHAGE) 1000 MG tablet; Take 1 tablet (1,000 mg total) by mouth 2 (two) times daily with a meal.  Dispense: 180 tablet; Refill: 3 - Blood Glucose Monitoring Suppl (TRUE METRIX METER) w/Device KIT; Check blood sugars fasting and at bedtime and record  Dispense: 1 kit; Refill: 0 - glucose blood (TRUE METRIX BLOOD GLUCOSE TEST) test strip; Use as instructed  Dispense: 100 each; Refill: 12 - TRUEPLUS LANCETS 28G MISC; Check blood sugars fasting and at bedtime and record  Dispense: 100 each; Refill: 1 Check blood sugars fasting and and bedtime and record and bring to next visit.  Make sure you are drinking at least 100 ounces of water daily.  Start to cut out sugars and white carbohydrates from your diet. I have had a lengthy discussion and provided education about insulin resistance and the intake of too much sugar/refined carbohydrates.  I have advised the patient to work at a goal of eliminating sugary drinks, candy, desserts, sweets, refined sugars, processed foods, and white carbohydrates.  The patient expresses understanding.   4. Hypertension, unspecified type Will start for kidney protection and likely has htn even when Hgb is stabilized.  BUN/Cr/GFR WNL. - lisinopril (PRINIVIL,ZESTRIL) 5 MG tablet; Take 1 tablet (5 mg total) by mouth daily.  Dispense: 90 tablet; Refill: 3 Check BP out of the office daily and record and bring to next visit.  He has a  cuff at home. We have discussed target BP range and blood pressure goal. I have advised patient to check BP regularly and to call us back or report to clinic if the numbers are consistently higher than 140/90. We discussed the importance of compliance with medical therapy and DASH diet recommended, consequences of uncontrolled hypertension discussed.   5. Ingrown nail with paronychia Salt water soaks tid.  Watch closely - cephALEXin (KEFLEX) 500 MG capsule; Take 1 capsule (500 mg total) by mouth 3 (three) times daily.  Dispense: 30 capsule; Refill: 0  Patient have been counseled extensively about nutrition and exercise  Return in about 3 weeks (around 12/07/2016) for assign PCP; f/up newly diagnosed DM and htn and polycythemia vera.  The patient was given clear instructions to go to ER or return to medical center if symptoms don't improve, worsen or new problems develop. The patient verbalized understanding. The patient was told to call  to get lab results if they haven't heard anything in the next week.     Freeman Caldron, PA-C [facility] [place], [place]   11/16/2016, 4:06 PMPatient ID: Stephens November, male   DOB: [birth year], 36 y.o.   MRN: [id number]

## 2016-11-17 ENCOUNTER — Ambulatory Visit (HOSPITAL_BASED_OUTPATIENT_CLINIC_OR_DEPARTMENT_OTHER): Payer: Self-pay

## 2016-11-17 VITALS — BP 140/98 | HR 97 | Temp 98.0°F | Resp 16

## 2016-11-17 DIAGNOSIS — D471 Chronic myeloproliferative disease: Secondary | ICD-10-CM

## 2016-11-17 DIAGNOSIS — D751 Secondary polycythemia: Secondary | ICD-10-CM

## 2016-11-17 NOTE — Patient Instructions (Signed)
Therapeutic Phlebotomy Therapeutic phlebotomy is the controlled removal of blood from a person's body for the purpose of treating a medical condition. The procedure is similar to donating blood. Usually, about a pint (470 mL, or 0.47L) of blood is removed. The average adult has 9-12 pints (4.3-5.7 L) of blood. Therapeutic phlebotomy may be used to treat the following medical conditions:  Hemochromatosis. This is a condition in which the blood contains too much iron.  Polycythemia vera. This is a condition in which the blood contains too many red blood cells.  Porphyria cutanea tarda. This is a disease in which an important part of hemoglobin is not made properly. It results in the buildup of abnormal amounts of porphyrins in the body.  Sickle cell disease. This is a condition in which the red blood cells form an abnormal crescent shape rather than a round shape. Tell a health care provider about:  Any allergies you have.  All medicines you are taking, including vitamins, herbs, eye drops, creams, and over-the-counter medicines.  Any problems you or family members have had with anesthetic medicines.  Any blood disorders you have.  Any surgeries you have had.  Any medical conditions you have. What are the risks? Generally, this is a safe procedure. However, problems may occur, including:  Nausea or light-headedness.  Low blood pressure.  Soreness, bleeding, swelling, or bruising at the needle insertion site.  Infection. What happens before the procedure?  Follow instructions from your health care provider about eating or drinking restrictions.  Ask your health care provider about changing or stopping your regular medicines. This is especially important if you are taking diabetes medicines or blood thinners.  Wear clothing with sleeves that can be raised above the elbow.  Plan to have someone take you home after the procedure.  You may have a blood sample taken. What happens  during the procedure?  A needle will be inserted into one of your veins.  Tubing and a collection bag will be attached to that needle.  Blood will flow through the needle and tubing into the collection bag.  You may be asked to open and close your hand slowly and continually during the entire collection.  After the specified amount of blood has been removed from your body, the collection bag and tubing will be clamped.  The needle will be removed from your vein.  Pressure will be held on the site of the needle insertion to stop the bleeding.  A bandage (dressing) will be placed over the needle insertion site. The procedure may vary among health care providers and hospitals. What happens after the procedure?  Your recovery will be assessed and monitored.  You can return to your normal activities as directed by your health care provider. This information is not intended to replace advice given to you by your health care provider. Make sure you discuss any questions you have with your health care provider. Document Released: 12/26/2010 Document Revised: 03/25/2016 Document Reviewed: 07/20/2014 Elsevier Interactive Patient Education  2017 [employer]  

## 2016-11-20 ENCOUNTER — Other Ambulatory Visit (HOSPITAL_BASED_OUTPATIENT_CLINIC_OR_DEPARTMENT_OTHER): Payer: Self-pay

## 2016-11-20 ENCOUNTER — Ambulatory Visit (HOSPITAL_BASED_OUTPATIENT_CLINIC_OR_DEPARTMENT_OTHER): Payer: Self-pay

## 2016-11-20 ENCOUNTER — Ambulatory Visit (HOSPITAL_BASED_OUTPATIENT_CLINIC_OR_DEPARTMENT_OTHER): Payer: Self-pay | Admitting: Hematology and Oncology

## 2016-11-20 ENCOUNTER — Telehealth: Payer: Self-pay | Admitting: Hematology and Oncology

## 2016-11-20 ENCOUNTER — Encounter: Payer: Self-pay | Admitting: Hematology and Oncology

## 2016-11-20 VITALS — BP 136/87 | HR 100 | Resp 18

## 2016-11-20 DIAGNOSIS — D471 Chronic myeloproliferative disease: Secondary | ICD-10-CM

## 2016-11-20 DIAGNOSIS — I10 Essential (primary) hypertension: Secondary | ICD-10-CM

## 2016-11-20 DIAGNOSIS — E119 Type 2 diabetes mellitus without complications: Secondary | ICD-10-CM | POA: Insufficient documentation

## 2016-11-20 DIAGNOSIS — D751 Secondary polycythemia: Secondary | ICD-10-CM

## 2016-11-20 LAB — CBC WITH DIFFERENTIAL/PLATELET
BASO%: 1 % (ref 0.0–2.0)
Basophils Absolute: 0.2 10*3/uL — ABNORMAL HIGH (ref 0.0–0.1)
EOS%: 3.1 % (ref 0.0–7.0)
Eosinophils Absolute: 0.6 10*3/uL — ABNORMAL HIGH (ref 0.0–0.5)
HCT: 51.7 % — ABNORMAL HIGH (ref 38.4–49.9)
HGB: 16.2 g/dL (ref 13.0–17.1)
LYMPH#: 1.5 10*3/uL (ref 0.9–3.3)
LYMPH%: 7.9 % — AB (ref 14.0–49.0)
MCH: 19.8 pg — ABNORMAL LOW (ref 27.2–33.4)
MCHC: 31.4 g/dL — ABNORMAL LOW (ref 32.0–36.0)
MCV: 63.1 fL — ABNORMAL LOW (ref 79.3–98.0)
MONO#: 0.5 10*3/uL (ref 0.1–0.9)
MONO%: 2.6 % (ref 0.0–14.0)
NEUT%: 85.4 % — ABNORMAL HIGH (ref 39.0–75.0)
NEUTROS ABS: 16.4 10*3/uL — AB (ref 1.5–6.5)
PLATELETS: 516 10*3/uL — AB (ref 140–400)
RBC: 8.2 10*6/uL — AB (ref 4.20–5.82)
RDW: 20.2 % — ABNORMAL HIGH (ref 11.0–14.6)
WBC: 19.2 10*3/uL — AB (ref 4.0–10.3)

## 2016-11-20 LAB — TECHNOLOGIST REVIEW

## 2016-11-20 NOTE — Assessment & Plan Note (Signed)
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 He is not symptomatic I recommend another phlebotomy today to keep hematocrit under 50 After today's treatment, I will see him back in 2 weeks Depending on how rapid his CBC changes, he will probably need phlebotomy regularly in the future We also discussed hydroxyurea today With his young age, he is not keen to start hydroxyurea He will continue aspirin therapy

## 2016-11-20 NOTE — Patient Instructions (Signed)
Therapeutic Phlebotomy Therapeutic phlebotomy is the controlled removal of blood from a person's body for the purpose of treating a medical condition. The procedure is similar to donating blood. Usually, about a pint (470 mL, or 0.47L) of blood is removed. The average adult has 9-12 pints (4.3-5.7 L) of blood. Therapeutic phlebotomy may be used to treat the following medical conditions:  Hemochromatosis. This is a condition in which the blood contains too much iron.  Polycythemia vera. This is a condition in which the blood contains too many red blood cells.  Porphyria cutanea tarda. This is a disease in which an important part of hemoglobin is not made properly. It results in the buildup of abnormal amounts of porphyrins in the body.  Sickle cell disease. This is a condition in which the red blood cells form an abnormal crescent shape rather than a round shape. Tell a health care provider about:  Any allergies you have.  All medicines you are taking, including vitamins, herbs, eye drops, creams, and over-the-counter medicines.  Any problems you or family members have had with anesthetic medicines.  Any blood disorders you have.  Any surgeries you have had.  Any medical conditions you have. What are the risks? Generally, this is a safe procedure. However, problems may occur, including:  Nausea or light-headedness.  Low blood pressure.  Soreness, bleeding, swelling, or bruising at the needle insertion site.  Infection. What happens before the procedure?  Follow instructions from your health care provider about eating or drinking restrictions.  Ask your health care provider about changing or stopping your regular medicines. This is especially important if you are taking diabetes medicines or blood thinners.  Wear clothing with sleeves that can be raised above the elbow.  Plan to have someone take you home after the procedure.  You may have a blood sample taken. What happens  during the procedure?  A needle will be inserted into one of your veins.  Tubing and a collection bag will be attached to that needle.  Blood will flow through the needle and tubing into the collection bag.  You may be asked to open and close your hand slowly and continually during the entire collection.  After the specified amount of blood has been removed from your body, the collection bag and tubing will be clamped.  The needle will be removed from your vein.  Pressure will be held on the site of the needle insertion to stop the bleeding.  A bandage (dressing) will be placed over the needle insertion site. The procedure may vary among health care providers and hospitals. What happens after the procedure?  Your recovery will be assessed and monitored.  You can return to your normal activities as directed by your health care provider. This information is not intended to replace advice given to you by your health care provider. Make sure you discuss any questions you have with your health care provider. Document Released: 12/26/2010 Document Revised: 03/25/2016 Document Reviewed: 07/20/2014 Elsevier Interactive Patient Education  2017 [employer]  

## 2016-11-20 NOTE — Telephone Encounter (Signed)
Gave patient AVS and calender per 4/16 los.  

## 2016-11-20 NOTE — Assessment & Plan Note (Signed)
The hypertension is likely induced by polycythemia With phlebotomy, his blood pressure has improved. Systolic blood pressure is in the 130s I recommend him to continue to monitor his blood pressure carefully If systolic blood pressures greater than 037 and diastolic blood pressures greater than 95, he will take blood pressure medication as prescribed by his primary care doctor

## 2016-11-20 NOTE — Progress Notes (Signed)
541ml phlebotomized over 10 minutes. 18 gauge needle to L AC. Patient tolerated procedure well. No complaints before, during, or after. Drink offered, snack refused.   Wylene Simmer, BSN, RN 11/20/2016 9:32 AM  30 minute observation went well. No complaints via patient. Discharge instructions given, patient verbalized understanding.  Wylene Simmer, BSN, RN 11/20/2016 9:52 AM

## 2016-11-20 NOTE — Progress Notes (Signed)
[place] OFFICE PROGRESS NOTE  Patient Care Team: No Pcp Per Patient as PCP - General (General Practice)  SUMMARY OF ONCOLOGIC HISTORY:  He was found to have abnormal CBC on admission with leukocytosis, erythrocytosis but with normal platelet count on recent hospitalization. Polycythemia was suspected.   He denies fever or chills, but does have frequent night sweats. He has intermittent headaches. He denies chest pain or shortness of breath, but had frequent leg cramps. He had symptoms of erythromyalgias. Denies a history of arthritis, kidney disease or gout. He denies any vision changes. He denies testosterone use. Was never diagnosed with congenital heart disease.  He never suffer from diagnosis of blood clot but does have family history of stroke in his father and paternal uncle. He does not take any aspirin products.There is no prior diagnosis of obstructive sleep apnea. The patient denies weight loss or pruritus.The patient is an occasional smoker consuming 1 cigar every 4 or 5 days, but does work in a [employer] and is exposed to second hand smoking by his wife.  On admission in October 2015, his CBC showed a Hb of 22.4, Hct of 71.4 with a WBC of 17.2. platelets were normal. The patient had numerous phlebotomy session while hospitalized recently. 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  He was recommended regular phlebotomy and aspirin.  He was subsequently lost to follow-up On 11/15/2016, he resume follow-up and frequent phlebotomy  INTERVAL HISTORY: Please see below for problem oriented charting. He feels better with frequent phlebotomy He had phlebotomy every day last week. He tolerated phlebotomy well His hemoglobin has rapidly improved. He denies chest pain, dizziness or leg cramps  REVIEW OF SYSTEMS:   Constitutional: Denies fevers, chills or abnormal weight loss Eyes: Denies blurriness of  vision Ears, nose, mouth, throat, and face: Denies mucositis or sore throat Respiratory: Denies cough, dyspnea or wheezes Cardiovascular: Denies palpitation, chest discomfort or lower extremity swelling Gastrointestinal:  Denies nausea, heartburn or change in bowel habits Skin: Denies abnormal skin rashes Lymphatics: Denies new lymphadenopathy or easy bruising Neurological:Denies numbness, tingling or new weaknesses Behavioral/Psych: Mood is stable, no new changes  All other systems were reviewed with the patient and are negative.  I have reviewed the past medical history, past surgical history, social history and family history with the patient and they are unchanged from previous note.  ALLERGIES:  is allergic to adhesive [tape].  MEDICATIONS:  Current Outpatient Prescriptions  Medication Sig Dispense Refill  . acetaminophen (TYLENOL) 500 MG tablet Take 1,000 mg by mouth every 6 (six) hours as needed (For pain.).     Marland Kitchen aspirin 81 MG chewable tablet Chew 1 tablet (81 mg total) by mouth daily. 30 tablet 3  . Blood Glucose Monitoring Suppl (TRUE METRIX METER) w/Device KIT Check blood sugars fasting and at bedtime and record 1 kit 0  . cephALEXin (KEFLEX) 500 MG capsule Take 1 capsule (500 mg total) by mouth 3 (three) times daily. 30 capsule 0  . glucose blood (TRUE METRIX BLOOD GLUCOSE TEST) test strip Use as instructed 100 each 12  . ibuprofen (ADVIL,MOTRIN) 200 MG tablet Take 800 mg by mouth every 6 (six) hours as needed (For pain.).     Marland Kitchen lisinopril (PRINIVIL,ZESTRIL) 5 MG tablet Take 1 tablet (5 mg total) by mouth daily. 90 tablet 3  . Melatonin 5 MG CAPS Take by mouth.    . TRUEPLUS LANCETS 28G MISC Check blood  sugars fasting and at bedtime and record 100 each 1  . metFORMIN (GLUCOPHAGE) 1000 MG tablet Take 1 tablet (1,000 mg total) by mouth 2 (two) times daily with a meal. (Patient not taking: Reported on 11/20/2016) 180 tablet 3   No current facility-administered medications for  this visit.     PHYSICAL EXAMINATION: ECOG PERFORMANCE STATUS: 1 - Symptomatic but completely ambulatory  Vitals:   11/20/16 0824  BP: 138/79  Pulse: (!) 101  Resp: 18  Temp: 98.2 F (36.8 C)   Filed Weights   11/20/16 0824  Weight: 256 lb 11.2 oz (116.4 kg)    GENERAL:alert, no distress and comfortable SKIN: skin color, texture, turgor are normal, no rashes or significant lesions EYES: normal, Conjunctiva are pink and non-injected, sclera clear Musculoskeletal:no cyanosis of digits and no clubbing  NEURO: alert & oriented x 3 with fluent speech, no focal motor/sensory deficits  LABORATORY DATA:  I have reviewed the data as listed    Component Value Date/Time   NA 137 11/13/2016 1227   K 4.2 11/13/2016 1227   CL 103 11/13/2016 1227   CO2 25 11/13/2016 1227   GLUCOSE 187 (H) 11/13/2016 1227   BUN 11 11/13/2016 1227   CREATININE 1.08 11/13/2016 1227   CALCIUM 8.6 (L) 11/13/2016 1227   PROT 7.1 09/05/2014 0641   ALBUMIN 3.4 (L) 09/05/2014 0641   AST 17 09/05/2014 0641   ALT 19 09/05/2014 0641   ALKPHOS 141 (H) 09/05/2014 0641   BILITOT 1.0 09/05/2014 0641   GFRNONAA >60 11/13/2016 1227   GFRAA >60 11/13/2016 1227    No results found for: SPEP, UPEP  Lab Results  Component Value Date   WBC 19.2 (H) 11/20/2016   NEUTROABS 16.4 (H) 11/20/2016   HGB 16.2 11/20/2016   HCT 51.7 (H) 11/20/2016   MCV 63.1 (L) 11/20/2016   PLT 516 (H) 11/20/2016      Chemistry      Component Value Date/Time   NA 137 11/13/2016 1227   K 4.2 11/13/2016 1227   CL 103 11/13/2016 1227   CO2 25 11/13/2016 1227   BUN 11 11/13/2016 1227   CREATININE 1.08 11/13/2016 1227      Component Value Date/Time   CALCIUM 8.6 (L) 11/13/2016 1227   ALKPHOS 141 (H) 09/05/2014 0641   AST 17 09/05/2014 0641   ALT 19 09/05/2014 0641   BILITOT 1.0 09/05/2014 0641      ASSESSMENT & PLAN:  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 He is not symptomatic I recommend another phlebotomy today to keep hematocrit under 50 After today's treatment, I will see him back in 2 weeks Depending on how rapid his CBC changes, he will probably need phlebotomy regularly in the future We also discussed hydroxyurea today With his young age, he is not keen to start hydroxyurea He will continue aspirin therapy  HTN (hypertension) The hypertension is likely induced by polycythemia With phlebotomy, his blood pressure has improved. Systolic blood pressure is in the 130s I recommend him to continue to monitor his blood pressure carefully If systolic blood pressures greater than 032 and diastolic blood pressures greater than 95, he will take blood pressure medication as prescribed by his primary care doctor  Type 2 diabetes mellitus without complication, without long-term current use of insulin ([place]) He is diagnosed with diabetes and has started metformin With frequent phlebotomy, he can reduce the accuracy of hemoglobin A1c He will continue  blood sugar monitoring at home   No orders of the defined types were placed in this encounter.  All questions were answered. The patient knows to call the clinic with any problems, questions or concerns. No barriers to learning was detected. I spent 15 minutes counseling the patient face to face. The total time spent in the appointment was 20 minutes and more than 50% was on counseling and review of test results     Heath Lark, MD 11/20/2016 9:22 AM

## 2016-11-20 NOTE — Progress Notes (Signed)
Per MD, phlebotomizing 1 unit today.   Wylene Simmer, BSN, RN 11/20/2016 9:08 AM

## 2016-11-20 NOTE — Assessment & Plan Note (Signed)
He is diagnosed with diabetes and has started metformin With frequent phlebotomy, he can reduce the accuracy of hemoglobin A1c He will continue blood sugar monitoring at home

## 2016-12-04 ENCOUNTER — Ambulatory Visit (HOSPITAL_BASED_OUTPATIENT_CLINIC_OR_DEPARTMENT_OTHER): Payer: Self-pay | Admitting: Hematology and Oncology

## 2016-12-04 ENCOUNTER — Other Ambulatory Visit (HOSPITAL_BASED_OUTPATIENT_CLINIC_OR_DEPARTMENT_OTHER): Payer: Self-pay

## 2016-12-04 ENCOUNTER — Telehealth: Payer: Self-pay | Admitting: Hematology and Oncology

## 2016-12-04 ENCOUNTER — Ambulatory Visit (HOSPITAL_BASED_OUTPATIENT_CLINIC_OR_DEPARTMENT_OTHER): Payer: Self-pay

## 2016-12-04 VITALS — BP 129/92 | HR 93 | Temp 98.3°F | Resp 18

## 2016-12-04 DIAGNOSIS — D471 Chronic myeloproliferative disease: Secondary | ICD-10-CM

## 2016-12-04 DIAGNOSIS — I10 Essential (primary) hypertension: Secondary | ICD-10-CM

## 2016-12-04 DIAGNOSIS — D751 Secondary polycythemia: Secondary | ICD-10-CM

## 2016-12-04 LAB — CBC WITH DIFFERENTIAL/PLATELET
BASO%: 1.4 % (ref 0.0–2.0)
Basophils Absolute: 0.2 10*3/uL — ABNORMAL HIGH (ref 0.0–0.1)
EOS%: 3.5 % (ref 0.0–7.0)
Eosinophils Absolute: 0.6 10*3/uL — ABNORMAL HIGH (ref 0.0–0.5)
HCT: 51.8 % — ABNORMAL HIGH (ref 38.4–49.9)
HEMOGLOBIN: 15.7 g/dL (ref 13.0–17.1)
LYMPH%: 9.7 % — AB (ref 14.0–49.0)
MCH: 18.9 pg — ABNORMAL LOW (ref 27.2–33.4)
MCHC: 30.3 g/dL — ABNORMAL LOW (ref 32.0–36.0)
MCV: 62.3 fL — ABNORMAL LOW (ref 79.3–98.0)
MONO#: 0.6 10*3/uL (ref 0.1–0.9)
MONO%: 3.2 % (ref 0.0–14.0)
NEUT#: 14.6 10*3/uL — ABNORMAL HIGH (ref 1.5–6.5)
NEUT%: 82.2 % — ABNORMAL HIGH (ref 39.0–75.0)
Platelets: 514 10*3/uL — ABNORMAL HIGH (ref 140–400)
RBC: 8.3 10*6/uL — AB (ref 4.20–5.82)
RDW: 19.5 % — AB (ref 11.0–14.6)
WBC: 17.7 10*3/uL — AB (ref 4.0–10.3)
lymph#: 1.7 10*3/uL (ref 0.9–3.3)

## 2016-12-04 NOTE — Telephone Encounter (Signed)
Gave patient AVS and calender per 4/30 los.  

## 2016-12-04 NOTE — Progress Notes (Signed)
Pt discharged after 30 minute post-observation.  Pt had snack of cheese and crackers and water to drink after phlebotomy.  Vital signs stable, in no acute distress.  Ambulatory, denies dizziness or light-headedness.

## 2016-12-04 NOTE — Patient Instructions (Signed)
Therapeutic Phlebotomy Therapeutic phlebotomy is the controlled removal of blood from a person's body for the purpose of treating a medical condition. The procedure is similar to donating blood. Usually, about a pint (470 mL, or 0.47L) of blood is removed. The average adult has 9-12 pints (4.3-5.7 L) of blood. Therapeutic phlebotomy may be used to treat the following medical conditions:  Hemochromatosis. This is a condition in which the blood contains too much iron.  Polycythemia vera. This is a condition in which the blood contains too many red blood cells.  Porphyria cutanea tarda. This is a disease in which an important part of hemoglobin is not made properly. It results in the buildup of abnormal amounts of porphyrins in the body.  Sickle cell disease. This is a condition in which the red blood cells form an abnormal crescent shape rather than a round shape. Tell a health care provider about:  Any allergies you have.  All medicines you are taking, including vitamins, herbs, eye drops, creams, and over-the-counter medicines.  Any problems you or family members have had with anesthetic medicines.  Any blood disorders you have.  Any surgeries you have had.  Any medical conditions you have. What are the risks? Generally, this is a safe procedure. However, problems may occur, including:  Nausea or light-headedness.  Low blood pressure.  Soreness, bleeding, swelling, or bruising at the needle insertion site.  Infection. What happens before the procedure?  Follow instructions from your health care provider about eating or drinking restrictions.  Ask your health care provider about changing or stopping your regular medicines. This is especially important if you are taking diabetes medicines or blood thinners.  Wear clothing with sleeves that can be raised above the elbow.  Plan to have someone take you home after the procedure.  You may have a blood sample taken. What happens  during the procedure?  A needle will be inserted into one of your veins.  Tubing and a collection bag will be attached to that needle.  Blood will flow through the needle and tubing into the collection bag.  You may be asked to open and close your hand slowly and continually during the entire collection.  After the specified amount of blood has been removed from your body, the collection bag and tubing will be clamped.  The needle will be removed from your vein.  Pressure will be held on the site of the needle insertion to stop the bleeding.  A bandage (dressing) will be placed over the needle insertion site. The procedure may vary among health care providers and hospitals. What happens after the procedure?  Your recovery will be assessed and monitored.  You can return to your normal activities as directed by your health care provider. This information is not intended to replace advice given to you by your health care provider. Make sure you discuss any questions you have with your health care provider. Document Released: 12/26/2010 Document Revised: 03/25/2016 Document Reviewed: 07/20/2014 Elsevier Interactive Patient Education  2017 [employer]  

## 2016-12-05 ENCOUNTER — Encounter: Payer: Self-pay | Admitting: Hematology and Oncology

## 2016-12-05 NOTE — Assessment & Plan Note (Signed)
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 He is not symptomatic I recommend another phlebotomy today to keep hematocrit under 50 I will space out his phlebotomy to once a week After today's treatment, I will see him back in 4 weeks He will continue aspirin therapy

## 2016-12-05 NOTE — Assessment & Plan Note (Signed)
The hypertension is likely induced by polycythemia With phlebotomy, his blood pressure has improved. I recommend him to continue to monitor his blood pressure carefully If systolic blood pressures greater than 381 and diastolic blood pressures greater than 95, he will take blood pressure medication as prescribed by his primary care doctor

## 2016-12-05 NOTE — Progress Notes (Signed)
[place] OFFICE PROGRESS NOTE  Patient Care Team: No Pcp Per Patient as PCP - General (General Practice)  SUMMARY OF ONCOLOGIC HISTORY:  He was found to have abnormal CBC on admission with leukocytosis, erythrocytosis but with normal platelet count on recent hospitalization. Polycythemia was suspected.   He denies fever or chills, but does have frequent night sweats. He has intermittent headaches. He denies chest pain or shortness of breath, but had frequent leg cramps. He had symptoms of erythromyalgias. Denies a history of arthritis, kidney disease or gout. He denies any vision changes. He denies testosterone use. Was never diagnosed with congenital heart disease.  He never suffer from diagnosis of blood clot but does have family history of stroke in his father and paternal uncle. He does not take any aspirin products.There is no prior diagnosis of obstructive sleep apnea. The patient denies weight loss or pruritus.The patient is an occasional smoker consuming 1 cigar every 4 or 5 days, but does work in a [employer] and is exposed to second hand smoking by his wife.  On admission in October 2015, his CBC showed a Hb of 22.4, Hct of 71.4 with a WBC of 17.2. platelets were normal. The patient had numerous phlebotomy session while hospitalized recently. 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  He was recommended regular phlebotomy and aspirin.  He was subsequently lost to follow-up On 11/15/2016, he resume follow-up and frequent phlebotomy  INTERVAL HISTORY: Please see below for problem oriented charting. He felt better with phlebotomy.  Blood pressure and blood sugar control is stable He tolerated phlebotomy well Denies recent dizziness, chest pain or shortness of breath  REVIEW OF SYSTEMS:   Constitutional: Denies fevers, chills or abnormal weight loss Eyes: Denies blurriness of vision Ears, nose, mouth,  throat, and face: Denies mucositis or sore throat Respiratory: Denies cough, dyspnea or wheezes Cardiovascular: Denies palpitation, chest discomfort or lower extremity swelling Gastrointestinal:  Denies nausea, heartburn or change in bowel habits Skin: Denies abnormal skin rashes Lymphatics: Denies new lymphadenopathy or easy bruising Neurological:Denies numbness, tingling or new weaknesses Behavioral/Psych: Mood is stable, no new changes  All other systems were reviewed with the patient and are negative.  I have reviewed the past medical history, past surgical history, social history and family history with the patient and they are unchanged from previous note.  ALLERGIES:  is allergic to adhesive [tape].  MEDICATIONS:  Current Outpatient Prescriptions  Medication Sig Dispense Refill  . acetaminophen (TYLENOL) 500 MG tablet Take 1,000 mg by mouth every 6 (six) hours as needed (For pain.).     Marland Kitchen aspirin 81 MG chewable tablet Chew 1 tablet (81 mg total) by mouth daily. 30 tablet 3  . Blood Glucose Monitoring Suppl (TRUE METRIX METER) w/Device KIT Check blood sugars fasting and at bedtime and record 1 kit 0  . cephALEXin (KEFLEX) 500 MG capsule Take 1 capsule (500 mg total) by mouth 3 (three) times daily. 30 capsule 0  . glucose blood (TRUE METRIX BLOOD GLUCOSE TEST) test strip Use as instructed 100 each 12  . ibuprofen (ADVIL,MOTRIN) 200 MG tablet Take 800 mg by mouth every 6 (six) hours as needed (For pain.).     Marland Kitchen lisinopril (PRINIVIL,ZESTRIL) 5 MG tablet Take 1 tablet (5 mg total) by mouth daily. 90 tablet 3  . Melatonin 5 MG CAPS Take by mouth.    . metFORMIN (GLUCOPHAGE) 1000 MG tablet Take 1 tablet (1,000  mg total) by mouth 2 (two) times daily with a meal. (Patient not taking: Reported on 11/20/2016) 180 tablet 3  . TRUEPLUS LANCETS 28G MISC Check blood sugars fasting and at bedtime and record 100 each 1   No current facility-administered medications for this visit.     PHYSICAL  EXAMINATION: ECOG PERFORMANCE STATUS: 0 - Asymptomatic  Vitals:   12/04/16 1521  BP: (!) 144/94  Pulse: 89  Resp: 18  Temp: 98.6 F (37 C)   Filed Weights   12/04/16 1521  Weight: 258 lb 14.4 oz (117.4 kg)    GENERAL:alert, no distress and comfortable SKIN: skin color, texture, turgor are normal, no rashes or significant lesions EYES: normal, Conjunctiva are pink and non-injected, sclera clear OROPHARYNX:no exudate, no erythema and lips, buccal mucosa, and tongue normal  NECK: supple, thyroid normal size, non-tender, without nodularity LYMPH:  no palpable lymphadenopathy in the cervical, axillary or inguinal LUNGS: clear to auscultation and percussion with normal breathing effort HEART: regular rate & rhythm and no murmurs and no lower extremity edema ABDOMEN:abdomen soft, non-tender and normal bowel sounds Musculoskeletal:no cyanosis of digits and no clubbing  NEURO: alert & oriented x 3 with fluent speech, no focal motor/sensory deficits  LABORATORY DATA:  I have reviewed the data as listed    Component Value Date/Time   NA 137 11/13/2016 1227   K 4.2 11/13/2016 1227   CL 103 11/13/2016 1227   CO2 25 11/13/2016 1227   GLUCOSE 187 (H) 11/13/2016 1227   BUN 11 11/13/2016 1227   CREATININE 1.08 11/13/2016 1227   CALCIUM 8.6 (L) 11/13/2016 1227   PROT 7.1 09/05/2014 0641   ALBUMIN 3.4 (L) 09/05/2014 0641   AST 17 09/05/2014 0641   ALT 19 09/05/2014 0641   ALKPHOS 141 (H) 09/05/2014 0641   BILITOT 1.0 09/05/2014 0641   GFRNONAA >60 11/13/2016 1227   GFRAA >60 11/13/2016 1227    No results found for: SPEP, UPEP  Lab Results  Component Value Date   WBC 17.7 (H) 12/04/2016   NEUTROABS 14.6 (H) 12/04/2016   HGB 15.7 12/04/2016   HCT 51.8 (H) 12/04/2016   MCV 62.3 (L) 12/04/2016   PLT 514 (H) 12/04/2016      Chemistry      Component Value Date/Time   NA 137 11/13/2016 1227   K 4.2 11/13/2016 1227   CL 103 11/13/2016 1227   CO2 25 11/13/2016 1227   BUN 11  11/13/2016 1227   CREATININE 1.08 11/13/2016 1227      Component Value Date/Time   CALCIUM 8.6 (L) 11/13/2016 1227   ALKPHOS 141 (H) 09/05/2014 0641   AST 17 09/05/2014 0641   ALT 19 09/05/2014 0641   BILITOT 1.0 09/05/2014 0641     ASSESSMENT & PLAN:  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 He is not symptomatic I recommend another phlebotomy today to keep hematocrit under 50 I will space out his phlebotomy to once a week After today's treatment, I will see him back in 4 weeks He will continue aspirin therapy  HTN (hypertension) The hypertension is likely induced by polycythemia With phlebotomy, his blood pressure has improved. I recommend him to continue to monitor his blood pressure carefully If systolic blood pressures greater than 774 and diastolic blood pressures greater than 95, he will take blood pressure medication as prescribed by his primary care doctor   No orders of the defined types were placed in this  encounter.  All questions were answered. The patient knows to call the clinic with any problems, questions or concerns. No barriers to learning was detected. I spent 10 minutes counseling the patient face to face. The total time spent in the appointment was 15 minutes and more than 50% was on counseling and review of test results     Heath Lark, MD 12/05/2016 6:57 PM

## 2016-12-13 ENCOUNTER — Ambulatory Visit: Payer: Self-pay | Attending: Family Medicine | Admitting: Family Medicine

## 2016-12-13 VITALS — BP 143/98 | HR 90 | Temp 98.1°F | Resp 18 | Ht 73.0 in | Wt 253.0 lb

## 2016-12-13 DIAGNOSIS — E1165 Type 2 diabetes mellitus with hyperglycemia: Secondary | ICD-10-CM

## 2016-12-13 DIAGNOSIS — L600 Ingrowing nail: Secondary | ICD-10-CM | POA: Insufficient documentation

## 2016-12-13 DIAGNOSIS — I10 Essential (primary) hypertension: Secondary | ICD-10-CM | POA: Insufficient documentation

## 2016-12-13 DIAGNOSIS — Z7982 Long term (current) use of aspirin: Secondary | ICD-10-CM | POA: Insufficient documentation

## 2016-12-13 DIAGNOSIS — E119 Type 2 diabetes mellitus without complications: Secondary | ICD-10-CM | POA: Insufficient documentation

## 2016-12-13 DIAGNOSIS — Z79899 Other long term (current) drug therapy: Secondary | ICD-10-CM | POA: Insufficient documentation

## 2016-12-13 DIAGNOSIS — R230 Cyanosis: Secondary | ICD-10-CM | POA: Insufficient documentation

## 2016-12-13 DIAGNOSIS — Z7984 Long term (current) use of oral hypoglycemic drugs: Secondary | ICD-10-CM | POA: Insufficient documentation

## 2016-12-13 DIAGNOSIS — L03031 Cellulitis of right toe: Secondary | ICD-10-CM | POA: Insufficient documentation

## 2016-12-13 LAB — GLUCOSE, POCT (MANUAL RESULT ENTRY): POC GLUCOSE: 127 mg/dL — AB (ref 70–99)

## 2016-12-13 MED ORDER — SULFAMETHOXAZOLE-TRIMETHOPRIM 800-160 MG PO TABS: 1.0000 | ORAL_TABLET | Freq: Two times a day (BID) | ORAL | 0 refills | Status: DC

## 2016-12-13 MED ORDER — GLIPIZIDE 5 MG PO TABS: 5.0000 mg | ORAL_TABLET | Freq: Every day | ORAL | 2 refills | Status: DC

## 2016-12-13 NOTE — Progress Notes (Signed)
Patient is here for DM & HTN Patient denies pain for today

## 2016-12-13 NOTE — Patient Instructions (Signed)
Type 2 Diabetes Mellitus, Self Care, Adult When you have type 2 diabetes (type 2 diabetes mellitus), you must keep your blood sugar (glucose) under control. You can do this with:  Nutrition.  Exercise.  Lifestyle changes.  Medicines or insulin, if needed.  Support from your doctors and others. How do I manage my blood sugar?  Check your blood sugar level every day, as often as told.  Call your doctor if your blood sugar is above your goal numbers for 2 tests in a row.  Have your A1c (hemoglobin A1c) level checked at least two times a year. Have it checked more often if your doctor tells you to. Your doctor will set treatment goals for you. Generally, you should have these blood sugar levels:  Before meals (preprandial): 80-130 mg/dL (4.4-7.2 mmol/L).  After meals (postprandial): lower than 180 mg/dL (10 mmol/L).  A1c level: less than 7%. What do I need to know about high blood sugar? High blood sugar is called hyperglycemia. Know the signs of high blood sugar. Signs may include:  Feeling:  Thirsty.  Hungry.  Very tired.  Needing to pee (urinate) more than usual.  Blurry vision. What do I need to know about low blood sugar? Low blood sugar is called hypoglycemia. This is when blood sugar is at or below 70 mg/dL (3.9 mmol/L). Symptoms may include:  Feeling:  Hungry.  Worried or nervous (anxious).  Sweaty and clammy.  Confused.  Dizzy.  Sleepy.  Sick to your stomach (nauseous).  Having:  A fast heartbeat (palpitations).  A headache.  A change in your vision.  Jerky movements that you cannot control (seizure).  Nightmares.  Tingling or no feeling (numbness) around the mouth, lips, or tongue.  Having trouble with:  Talking.  Paying attention (concentrating).  Moving (coordination).  Sleeping.  Shaking.  Passing out (fainting).  Getting upset easily (irritability). Treating low blood sugar  To treat low blood sugar, eat or drink  something sugary right away. If you can think clearly and swallow safely, follow the 15:15 rule:  Take 15 grams of a fast-acting carb (carbohydrate). Some fast-acting carbs are:  1 tube of glucose gel.  3 sugar tablets (glucose pills).  6-8 pieces of hard candy.  4 oz (120 mL) of fruit juice.  4 oz (120 mL) regular (not diet) soda.  Check your blood sugar 15 minutes after you take the carb.  If your blood sugar is still at or below 70 mg/dL (3.9 mmol/L), take 15 grams of a carb again.  If your blood sugar does not go above 70 mg/dL (3.9 mmol/L) after 3 tries, get help right away.  After your blood sugar goes back to normal, eat a meal or a snack within 1 hour. Treating very low blood sugar  If your blood sugar is at or below 54 mg/dL (3 mmol/L), you have very low blood sugar (severe hypoglycemia). This is an emergency. Do not wait to see if the symptoms will go away. Get medical help right away. Call your local emergency services (911 in the [place] Do not drive yourself to the hospital. If you have very low blood sugar and you cannot eat or drink, you may need a glucagon shot (injection). A family member or friend should learn how to check your blood sugar and how to give you a glucagon shot. Ask your doctor if you need to have a glucagon shot kit at home. What else is important to manage my diabetes? Medicine  Follow these instructions   about insulin and diabetes medicines:  Take them as told by your doctor.  Adjust them as told by your doctor.  Do not run out of them. Having diabetes can raise your risk for other long-term conditions. These include heart or kidney disease. Your doctor may prescribe medicines to help prevent problems from diabetes. Food   Make healthy food choices. These include:  Chicken, fish, egg whites, and beans.  Oats, whole wheat, bulgur, brown rice, quinoa, and millet.  Fresh fruits and vegetables.  Low-fat dairy products.  Nuts, avocado, olive  oil, and canola oil.  Make a food plan with a specialist (dietitian).  Follow instructions from your doctor about what you cannot eat or drink.  Drink enough fluid to keep your pee (urine) clear or pale yellow.  Eat healthy snacks between healthy meals.  Keep track of carbs that you eat. Read food labels. Learn food serving sizes.  Follow your sick day plan when you cannot eat or drink normally. Make this plan with your doctor so it is ready to use. Activity  Exercise at least 3 times a week.  Do not go more than 2 days without exercising.  Talk with your doctor before you start a new exercise. Your doctor may need to adjust your insulin, medicines, or food. Lifestyle   Do not use any tobacco products. These include cigarettes, chewing tobacco, and e-cigarettes.If you need help quitting, ask your doctor.  Ask your doctor how much alcohol is safe for you.  Learn to deal with stress. If you need help with this, ask your doctor. Body care  Stay up to date with your shots (immunizations).  Have your eyes and feet checked by a doctor as often as told.  Check your skin and feet every day. Check for cuts, bruises, redness, blisters, or sores.  Brush your teeth and gums two times a day.  Floss at least one time a day.  Go to the dentist least one time every 6 months.  Stay at a healthy weight. General instructions   Take over-the-counter and prescription medicines only as told by your doctor.  Share your diabetes care plan with:  Your work or school.  People you live with.  Check your pee (urine) for ketones:  When you are sick.  As told by your doctor.  Carry a card or wear jewelry that says that you have diabetes.  Ask your doctor:  Do I need to meet with a diabetes educator?  Where can I find a support group for people with diabetes?  Keep all follow-up visits as told by your doctor. This is important. Where to find more information: To learn more about  diabetes, visit:  American Diabetes Association: www.diabetes.org  American Association of Diabetes Educators: www.diabeteseducator.org/patient-resources This information is not intended to replace advice given to you by your health care provider. Make sure you discuss any questions you have with your health care provider. Document Released: 11/15/2015 Document Revised: 12/30/2015 Document Reviewed: 08/27/2015 Elsevier Interactive Patient Education  2017 [employer]  

## 2016-12-13 NOTE — Progress Notes (Signed)
Subjective:  Patient ID: Robert Avila, male    DOB: 01-26-1981  Age: 36 y.o. MRN: [id number]  CC: No chief complaint on file.   HPI Ugochukwu Chichester presents for   Hypertension: Patient here for follow-up of elevated blood pressure. He is not exercising and is adherent to low salt diet.  Blood pressure is not well controlled at home. He reports non-adherence with anti-hypertensive medication due to recommendation of hematologist. He report history of polycythemia vera.  Cardiac symptoms none. He does reports intermittent BLE color change that worsens with standing. Denies any swelling or pain.  Cardiovascular risk factors: diabetes mellitus, hypertension, male gender and sedentary lifestyle. Use of agents associated with hypertension: none. History of target organ damage: none.  Diabetes Mellitus: Patient presents for follow up of diabetes. Symptoms: none.  Patient denies foot ulcerations, nausea, paresthesia of the feet, visual disturbances and vomitting.  Evaluation to date has been included: fasting blood sugar and hemoglobin A1C.  Home sugars: BGs have been labile ranging between 150 and 223. Treatment to date: Continued metformin which has been somewhat effective. He does report history of right great toe cellulitis that was treated with course of antibiotics. Denies any drainage. Reports redness is still present.    Outpatient Medications Prior to Visit  Medication Sig Dispense Refill  . acetaminophen (TYLENOL) 500 MG tablet Take 1,000 mg by mouth every 6 (six) hours as needed (For pain.).     Marland Kitchen aspirin 81 MG chewable tablet Chew 1 tablet (81 mg total) by mouth daily. 30 tablet 3  . Blood Glucose Monitoring Suppl (TRUE METRIX METER) w/Device KIT Check blood sugars fasting and at bedtime and record 1 kit 0  . cephALEXin (KEFLEX) 500 MG capsule Take 1 capsule (500 mg total) by mouth 3 (three) times daily. 30 capsule 0  . glucose blood (TRUE METRIX BLOOD GLUCOSE TEST) test strip Use as instructed  100 each 12  . ibuprofen (ADVIL,MOTRIN) 200 MG tablet Take 800 mg by mouth every 6 (six) hours as needed (For pain.).     Marland Kitchen lisinopril (PRINIVIL,ZESTRIL) 5 MG tablet Take 1 tablet (5 mg total) by mouth daily. 90 tablet 3  . Melatonin 5 MG CAPS Take by mouth.    . metFORMIN (GLUCOPHAGE) 1000 MG tablet Take 1 tablet (1,000 mg total) by mouth 2 (two) times daily with a meal. (Patient not taking: Reported on 11/20/2016) 180 tablet 3  . TRUEPLUS LANCETS 28G MISC Check blood sugars fasting and at bedtime and record 100 each 1   No facility-administered medications prior to visit.     ROS Review of Systems  Constitutional: Negative.   Eyes: Negative.   Respiratory: Negative.   Cardiovascular: Negative.   Gastrointestinal: Negative.   Skin: Positive for color change.       History of cellulitis    Objective:  BP (!) 143/98 (BP Location: Left Arm, Patient Position: Sitting, Cuff Size: Normal)   Pulse 90   Temp 98.1 F (36.7 C) (Oral)   Resp 18   Ht _0  (1.854 m)   Wt 253 lb (114.8 kg)   SpO2 97%   BMI 33.38 kg/m   BP/Weight 12/13/2016 12/04/2016 3/53/2992  Systolic BP 426 834 196  Diastolic BP 98 94 92  Wt. (Lbs) 253 258.9 -  BMI 33.38 34.16 -     Physical Exam  Constitutional: He appears well-developed and well-nourished.  Eyes: Conjunctivae are normal. Pupils are equal, round, and reactive to light.  Neck: No JVD present.  Cardiovascular: Normal rate, regular rhythm, normal heart sounds and intact distal pulses.   Pulmonary/Chest: Effort normal and breath sounds normal.  Abdominal: Soft. Bowel sounds are normal.  Skin: Skin is warm and dry. There is erythema (right great toe cellulitis; no drainage present tender to touch.).  Intermittent cyanosis of BLE with standing to sitting position.   Nursing note and vitals reviewed.  Diabetic Foot Exam - Simple   Simple Foot Form Diabetic Foot exam was performed with the following findings:  Yes 12/18/2016  4:24 PM  Visual  Inspection See comments:  Yes Sensation Testing Intact to touch and monofilament testing bilaterally:  Yes Pulse Check Posterior Tibialis and Dorsalis pulse intact bilaterally:  Yes Comments Cellulitis of the right great toe. Mild redness and swelling present.        Assessment & Plan:   Problem List Items Addressed This Visit      Endocrine   Type 2 diabetes mellitus without complication, without long-term current use of insulin (Why) - Primary   Added glipizide for better blood glucose control  Follow up with clinical pharmacist in 2 weeks  Follow up with PCP in 3 months.     Relevant Medications   glipiZIDE (GLUCOTROL) 5 MG tablet    Other Visit Diagnoses    Ingrown nail       Relevant Orders   Ambulatory referral to Podiatry   Toe cyanosis       Relevant Orders   Ambulatory referral to Vascular Surgery   Cellulitis of toe of right foot       Relevant Medications   sulfamethoxazole-trimethoprim (BACTRIM DS,SEPTRA DS) 800-160 MG tablet   Extremity cyanosis       Relevant Orders   Ambulatory referral to Vascular Surgery      Meds ordered this encounter  Medications  . sulfamethoxazole-trimethoprim (BACTRIM DS,SEPTRA DS) 800-160 MG tablet    Sig: Take 1 tablet by mouth 2 (two) times daily.    Dispense:  28 tablet    Refill:  0    Order Specific Question:   Supervising Provider    Answer:   Tresa Garter [id number]  . glipiZIDE (GLUCOTROL) 5 MG tablet    Sig: Take 1 tablet (5 mg total) by mouth daily before breakfast.    Dispense:  30 tablet    Refill:  2    Order Specific Question:   Supervising Provider    Answer:   Tresa Garter [[id number]]    Follow-up: Return in about 2 weeks (around 12/27/2016) for DM and HTN with Stacy.   Alfonse Spruce FNP

## 2016-12-14 LAB — LIPID PANEL
CHOL/HDL RATIO: 3.8 ratio (ref 0.0–5.0)
CHOLESTEROL TOTAL: 126 mg/dL (ref 100–199)
HDL: 33 mg/dL — ABNORMAL LOW (ref >39)
LDL CALC: 60 mg/dL (ref 0–99)
TRIGLYCERIDES: 167 mg/dL — AB (ref 0–149)
VLDL Cholesterol Cal: 33 mg/dL (ref 5–40)

## 2016-12-18 ENCOUNTER — Ambulatory Visit: Payer: Self-pay

## 2016-12-18 ENCOUNTER — Other Ambulatory Visit (HOSPITAL_BASED_OUTPATIENT_CLINIC_OR_DEPARTMENT_OTHER): Payer: Self-pay

## 2016-12-18 DIAGNOSIS — D471 Chronic myeloproliferative disease: Secondary | ICD-10-CM

## 2016-12-18 LAB — CBC WITH DIFFERENTIAL/PLATELET
BASO%: 1.7 % (ref 0.0–2.0)
BASOS ABS: 0.3 10*3/uL — AB (ref 0.0–0.1)
EOS ABS: 0.7 10*3/uL — AB (ref 0.0–0.5)
EOS%: 3.6 % (ref 0.0–7.0)
HCT: 49.9 % (ref 38.4–49.9)
HGB: 15.1 g/dL (ref 13.0–17.1)
LYMPH%: 8.6 % — ABNORMAL LOW (ref 14.0–49.0)
MCH: 19 pg — ABNORMAL LOW (ref 27.2–33.4)
MCHC: 30.3 g/dL — ABNORMAL LOW (ref 32.0–36.0)
MCV: 62.8 fL — AB (ref 79.3–98.0)
MONO#: 1 10*3/uL — ABNORMAL HIGH (ref 0.1–0.9)
MONO%: 4.7 % (ref 0.0–14.0)
NEUT#: 16.7 10*3/uL — ABNORMAL HIGH (ref 1.5–6.5)
NEUT%: 81.4 % — AB (ref 39.0–75.0)
NRBC: 0 % (ref 0–0)
PLATELETS: 668 10*3/uL — AB (ref 140–400)
RBC: 7.94 10*6/uL — AB (ref 4.20–5.82)
RDW: 19.3 % — AB (ref 11.0–14.6)
WBC: 20.5 10*3/uL — ABNORMAL HIGH (ref 4.0–10.3)
lymph#: 1.8 10*3/uL (ref 0.9–3.3)

## 2016-12-18 LAB — TECHNOLOGIST REVIEW

## 2016-12-18 NOTE — Progress Notes (Signed)
HCT 49.9. Patient did not meet tx parameters.

## 2016-12-19 ENCOUNTER — Telehealth: Payer: Self-pay

## 2016-12-19 NOTE — Telephone Encounter (Signed)
-----   Message from Alfonse Spruce, [place] sent at 12/18/2016  9:36 AM EDT ----- Lipid and cholesterol lab shows elevated triglycerides and decreased HDL cholesterol. This can increase your risk of heart disease overtime. Start eating a diet low in saturated fat. Limit your intake of fried foods, red meats, and whole milk. Begin exercising at least 3-5 times per week for 30 minutes. Recommend follow up for lipid in 1 year.

## 2016-12-19 NOTE — Telephone Encounter (Signed)
CMA call patient regarding lab results  Patient did not answer but left a VM stating the reason of the call & to call back

## 2016-12-22 ENCOUNTER — Encounter: Payer: Self-pay | Admitting: Vascular Surgery

## 2016-12-25 ENCOUNTER — Other Ambulatory Visit (HOSPITAL_BASED_OUTPATIENT_CLINIC_OR_DEPARTMENT_OTHER): Payer: Self-pay

## 2016-12-25 ENCOUNTER — Ambulatory Visit (HOSPITAL_BASED_OUTPATIENT_CLINIC_OR_DEPARTMENT_OTHER): Payer: Self-pay

## 2016-12-25 DIAGNOSIS — D471 Chronic myeloproliferative disease: Secondary | ICD-10-CM

## 2016-12-25 LAB — CBC WITH DIFFERENTIAL/PLATELET
BASO%: 1 % (ref 0.0–2.0)
BASOS ABS: 0.3 10*3/uL — AB (ref 0.0–0.1)
EOS%: 3.3 % (ref 0.0–7.0)
Eosinophils Absolute: 0.8 10*3/uL — ABNORMAL HIGH (ref 0.0–0.5)
HCT: 49.1 % (ref 38.4–49.9)
HEMOGLOBIN: 15.1 g/dL (ref 13.0–17.1)
LYMPH#: 1.9 10*3/uL (ref 0.9–3.3)
LYMPH%: 7.8 % — ABNORMAL LOW (ref 14.0–49.0)
MCH: 18.5 pg — ABNORMAL LOW (ref 27.2–33.4)
MCHC: 30.7 g/dL — ABNORMAL LOW (ref 32.0–36.0)
MCV: 60.2 fL — AB (ref 79.3–98.0)
MONO#: 0.7 10*3/uL (ref 0.1–0.9)
MONO%: 3 % (ref 0.0–14.0)
NEUT#: 21.1 10*3/uL — ABNORMAL HIGH (ref 1.5–6.5)
NEUT%: 84.9 % — ABNORMAL HIGH (ref 39.0–75.0)
NRBC: 0 % (ref 0–0)
Platelets: 665 10*3/uL — ABNORMAL HIGH (ref 140–400)
RBC: 8.16 10*6/uL — AB (ref 4.20–5.82)
RDW: 19.1 % — AB (ref 11.0–14.6)
WBC: 24.8 10*3/uL — AB (ref 4.0–10.3)

## 2016-12-25 LAB — TECHNOLOGIST REVIEW

## 2016-12-25 NOTE — Progress Notes (Signed)
1402: per Dr Alvy Bimler draw off 1 Liter of blood with phlebotomy today 12/25/2016; regardless of labs today.  If Hgb from labs today (12/25/2016)  is <14 cancel phlebotomy appointment for 01/02/2017, if Hgb is > 14 keep phlebotomy appt for 01/02/2017.

## 2017-01-02 ENCOUNTER — Other Ambulatory Visit: Payer: Self-pay

## 2017-01-02 ENCOUNTER — Other Ambulatory Visit: Payer: Self-pay | Admitting: *Deleted

## 2017-01-02 DIAGNOSIS — I75021 Atheroembolism of right lower extremity: Secondary | ICD-10-CM

## 2017-01-03 ENCOUNTER — Encounter: Payer: Self-pay | Admitting: Vascular Surgery

## 2017-01-03 ENCOUNTER — Inpatient Hospital Stay (HOSPITAL_COMMUNITY): Admission: RE | Admit: 2017-01-03 | Payer: Self-pay | Source: Ambulatory Visit

## 2017-01-08 ENCOUNTER — Other Ambulatory Visit (HOSPITAL_BASED_OUTPATIENT_CLINIC_OR_DEPARTMENT_OTHER): Payer: Self-pay

## 2017-01-08 ENCOUNTER — Ambulatory Visit (HOSPITAL_BASED_OUTPATIENT_CLINIC_OR_DEPARTMENT_OTHER): Payer: Self-pay | Admitting: Hematology and Oncology

## 2017-01-08 ENCOUNTER — Telehealth: Payer: Self-pay | Admitting: Hematology and Oncology

## 2017-01-08 DIAGNOSIS — D473 Essential (hemorrhagic) thrombocythemia: Secondary | ICD-10-CM

## 2017-01-08 DIAGNOSIS — E118 Type 2 diabetes mellitus with unspecified complications: Secondary | ICD-10-CM

## 2017-01-08 DIAGNOSIS — Z720 Tobacco use: Secondary | ICD-10-CM

## 2017-01-08 DIAGNOSIS — D471 Chronic myeloproliferative disease: Secondary | ICD-10-CM

## 2017-01-08 DIAGNOSIS — D72829 Elevated white blood cell count, unspecified: Secondary | ICD-10-CM

## 2017-01-08 DIAGNOSIS — D751 Secondary polycythemia: Secondary | ICD-10-CM

## 2017-01-08 DIAGNOSIS — I10 Essential (primary) hypertension: Secondary | ICD-10-CM

## 2017-01-08 LAB — CBC WITH DIFFERENTIAL/PLATELET
BASO%: 0.9 % (ref 0.0–2.0)
Basophils Absolute: 0.2 10*3/uL — ABNORMAL HIGH (ref 0.0–0.1)
EOS ABS: 0.7 10*3/uL — AB (ref 0.0–0.5)
EOS%: 3.4 % (ref 0.0–7.0)
HEMATOCRIT: 43.7 % (ref 38.4–49.9)
HEMOGLOBIN: 13.3 g/dL (ref 13.0–17.1)
LYMPH#: 1.2 10*3/uL (ref 0.9–3.3)
LYMPH%: 6 % — ABNORMAL LOW (ref 14.0–49.0)
MCH: 18.2 pg — ABNORMAL LOW (ref 27.2–33.4)
MCHC: 30.4 g/dL — ABNORMAL LOW (ref 32.0–36.0)
MCV: 59.7 fL — AB (ref 79.3–98.0)
MONO#: 0.5 10*3/uL (ref 0.1–0.9)
MONO%: 2.3 % (ref 0.0–14.0)
NEUT%: 87.4 % — ABNORMAL HIGH (ref 39.0–75.0)
NEUTROS ABS: 18.1 10*3/uL — AB (ref 1.5–6.5)
PLATELETS: 668 10*3/uL — AB (ref 140–400)
RBC: 7.31 10*6/uL — ABNORMAL HIGH (ref 4.20–5.82)
RDW: 18.7 % — ABNORMAL HIGH (ref 11.0–14.6)
WBC: 20.7 10*3/uL — AB (ref 4.0–10.3)

## 2017-01-08 MED ORDER — METFORMIN HCL 1000 MG PO TABS: 1000.0000 mg | ORAL_TABLET | Freq: Two times a day (BID) | ORAL | 3 refills | Status: AC

## 2017-01-08 MED ORDER — HYDROXYUREA 500 MG PO CAPS: 500.0000 mg | ORAL_CAPSULE | Freq: Every day | ORAL | 9 refills | Status: AC

## 2017-01-08 MED FILL — HYDROXYUREA 500 MG CAPSULE: 500 | 30 days supply | Qty: 30 | Fill #0

## 2017-01-08 MED FILL — metFORMIN HCL 1000 MG TABS: 1000 | 90 days supply | Qty: 180 | Fill #0

## 2017-01-08 NOTE — Telephone Encounter (Signed)
Scheduled appt per 6/4 los - gave patient AVS and calender.

## 2017-01-10 ENCOUNTER — Encounter: Payer: Self-pay | Admitting: Hematology and Oncology

## 2017-01-10 NOTE — Progress Notes (Signed)
[place] OFFICE PROGRESS NOTE  Patient Care Team: Alfonse Spruce, FNP as PCP - General (Family Medicine)  SUMMARY OF ONCOLOGIC HISTORY:  He was found to have abnormal CBC on admission with leukocytosis, erythrocytosis but with normal platelet count on recent hospitalization. Polycythemia was suspected.   He denies fever or chills, but does have frequent night sweats. He has intermittent headaches. He denies chest pain or shortness of breath, but had frequent leg cramps. He had symptoms of erythromyalgias. Denies a history of arthritis, kidney disease or gout. He denies any vision changes. He denies testosterone use. Was never diagnosed with congenital heart disease.  He never suffer from diagnosis of blood clot but does have family history of stroke in his father and paternal uncle. He does not take any aspirin products.There is no prior diagnosis of obstructive sleep apnea. The patient denies weight loss or pruritus.The patient is an occasional smoker consuming 1 cigar every 4 or 5 days, but does work in a [employer] and is exposed to second hand smoking by his wife.  On admission in October 2015, his CBC showed a Hb of 22.4, Hct of 71.4 with a WBC of 17.2. platelets were normal. The patient had numerous phlebotomy session while hospitalized recently. 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  He was recommended regular phlebotomy and aspirin.  He was subsequently lost to follow-up On 11/15/2016, he resume follow-up and frequent phlebotomy  INTERVAL HISTORY: Please see below for problem oriented charting. He returns for further follow-up. The patient was locked up in jail recently due to domestic dispute. He is in the process of separation from his wife. In the meantime, he tolerated phlebotomy well. He had some mild dizziness with the last phlebotomy session but overall, he denies recent chest pain,  shortness of breath or recent blood clots  REVIEW OF SYSTEMS:   Constitutional: Denies fevers, chills or abnormal weight loss Eyes: Denies blurriness of vision Ears, nose, mouth, throat, and face: Denies mucositis or sore throat Respiratory: Denies cough, dyspnea or wheezes Cardiovascular: Denies palpitation, chest discomfort or lower extremity swelling Gastrointestinal:  Denies nausea, heartburn or change in bowel habits Skin: Denies abnormal skin rashes Lymphatics: Denies new lymphadenopathy or easy bruising Neurological:Denies numbness, tingling or new weaknesses Behavioral/Psych: Mood is stable, no new changes  All other systems were reviewed with the patient and are negative.  I have reviewed the past medical history, past surgical history, social history and family history with the patient and they are unchanged from previous note.  ALLERGIES:  is allergic to adhesive [tape].  MEDICATIONS:  Current Outpatient Prescriptions  Medication Sig Dispense Refill  . acetaminophen (TYLENOL) 500 MG tablet Take 1,000 mg by mouth every 6 (six) hours as needed (For pain.).     Marland Kitchen aspirin 81 MG chewable tablet Chew 1 tablet (81 mg total) by mouth daily. 30 tablet 3  . Blood Glucose Monitoring Suppl (TRUE METRIX METER) w/Device KIT Check blood sugars fasting and at bedtime and record 1 kit 0  . glipiZIDE (GLUCOTROL) 5 MG tablet Take 1 tablet (5 mg total) by mouth daily before breakfast. 30 tablet 2  . glucose blood (TRUE METRIX BLOOD GLUCOSE TEST) test strip Use as instructed 100 each 12  . ibuprofen (ADVIL,MOTRIN) 200 MG tablet Take 800 mg by mouth every 6 (six) hours as needed (For pain.).     Marland Kitchen lisinopril (PRINIVIL,ZESTRIL) 5 MG tablet Take 1 tablet (5  mg total) by mouth daily. 90 tablet 3  . Melatonin 5 MG CAPS Take by mouth.    . TRUEPLUS LANCETS 28G MISC Check blood sugars fasting and at bedtime and record 100 each 1  . hydroxyurea (HYDREA) 500 MG capsule Take 1 capsule (500 mg total) by  mouth daily. May take with food to minimize GI side effects. 30 capsule 9  . metFORMIN (GLUCOPHAGE) 1000 MG tablet Take 1 tablet (1,000 mg total) by mouth 2 (two) times daily with a meal. 180 tablet 3   No current facility-administered medications for this visit.     PHYSICAL EXAMINATION: ECOG PERFORMANCE STATUS: 1 - Symptomatic but completely ambulatory  Vitals:   01/08/17 1520  BP: (!) 149/89  Pulse: 96  Resp: 20  Temp: 98.6 F (37 C)   Filed Weights   01/08/17 1520  Weight: 257 lb 8 oz (116.8 kg)    GENERAL:alert, no distress and comfortable SKIN: skin color, texture, turgor are normal, no rashes or significant lesions EYES: normal, Conjunctiva are pink and non-injected, sclera clear Musculoskeletal:no cyanosis of digits and no clubbing  NEURO: alert & oriented x 3 with fluent speech, no focal motor/sensory deficits  LABORATORY DATA:  I have reviewed the data as listed    Component Value Date/Time   NA 137 11/13/2016 1227   K 4.2 11/13/2016 1227   CL 103 11/13/2016 1227   CO2 25 11/13/2016 1227   GLUCOSE 187 (H) 11/13/2016 1227   BUN 11 11/13/2016 1227   CREATININE 1.08 11/13/2016 1227   CALCIUM 8.6 (L) 11/13/2016 1227   PROT 7.1 09/05/2014 0641   ALBUMIN 3.4 (L) 09/05/2014 0641   AST 17 09/05/2014 0641   ALT 19 09/05/2014 0641   ALKPHOS 141 (H) 09/05/2014 0641   BILITOT 1.0 09/05/2014 0641   GFRNONAA >60 11/13/2016 1227   GFRAA >60 11/13/2016 1227    No results found for: SPEP, UPEP  Lab Results  Component Value Date   WBC 20.7 (H) 01/08/2017   NEUTROABS 18.1 (H) 01/08/2017   HGB 13.3 01/08/2017   HCT 43.7 01/08/2017   MCV 59.7 (L) 01/08/2017   PLT 668 (H) 01/08/2017      Chemistry      Component Value Date/Time   NA 137 11/13/2016 1227   K 4.2 11/13/2016 1227   CL 103 11/13/2016 1227   CO2 25 11/13/2016 1227   BUN 11 11/13/2016 1227   CREATININE 1.08 11/13/2016 1227      Component Value Date/Time   CALCIUM 8.6 (L) 11/13/2016 1227    ALKPHOS 141 (H) 09/05/2014 0641   AST 17 09/05/2014 0641   ALT 19 09/05/2014 0641   BILITOT 1.0 09/05/2014 0641      ASSESSMENT & PLAN:  Myeloproliferative disorder ([place]) He has excellent response of phlebotomy with acceptable hemoglobin level. Unfortunately, he has persistent leukocytosis and thrombocytosis. We discussed a trial of hydroxyurea. The risks, benefits, side effects of treatment is discussed with the patient and he agreed to proceed. I will see him back in 2-3 weeks for assessment of response to treatment In the meantime, he will continue aspirin therapy  HTN (hypertension) The hypertension is likely induced by polycythemia With phlebotomy, his blood pressure has improved. I recommend him to continue to monitor his blood pressure carefully If systolic blood pressures greater than 924 and diastolic blood pressures greater than 95, he will take blood pressure medication as prescribed by his primary care doctor   No orders of the defined types were placed  in this encounter.  All questions were answered. The patient knows to call the clinic with any problems, questions or concerns. No barriers to learning was detected. I spent 8 minutes counseling the patient face to face. The total time spent in the appointment was 10 minutes and more than 50% was on counseling and review of test results     Heath Lark, MD 01/10/2017 6:59 AM

## 2017-01-10 NOTE — Assessment & Plan Note (Signed)
The hypertension is likely induced by polycythemia With phlebotomy, his blood pressure has improved. I recommend him to continue to monitor his blood pressure carefully If systolic blood pressures greater than 937 and diastolic blood pressures greater than 95, he will take blood pressure medication as prescribed by his primary care doctor

## 2017-01-10 NOTE — Assessment & Plan Note (Signed)
He has excellent response of phlebotomy with acceptable hemoglobin level. Unfortunately, he has persistent leukocytosis and thrombocytosis. We discussed a trial of hydroxyurea. The risks, benefits, side effects of treatment is discussed with the patient and he agreed to proceed. I will see him back in 2-3 weeks for assessment of response to treatment In the meantime, he will continue aspirin therapy

## 2017-01-15 MED FILL — LISINOPRIL 5 MG TAB: 5 | 30 days supply | Qty: 30 | Fill #1

## 2017-01-24 ENCOUNTER — Ambulatory Visit: Payer: Self-pay | Admitting: Hematology and Oncology

## 2017-01-24 ENCOUNTER — Encounter: Payer: Self-pay | Admitting: Hematology and Oncology

## 2017-01-24 ENCOUNTER — Other Ambulatory Visit: Payer: Self-pay

## 2017-01-26 ENCOUNTER — Telehealth: Payer: Self-pay | Admitting: *Deleted

## 2017-01-26 NOTE — Telephone Encounter (Signed)
Wife called to say patient forgot about his appointment. "with everything going on, he is very forgetful". Appt rescheduled for 7/19. Patient wants to know if he should refill his hydrea when it runs out next week or wait until he sees Dr Alvy Bimler.   Notified we will let them know how to proceed on Monday.

## 2017-01-29 NOTE — Telephone Encounter (Signed)
I am not comfortable refilling medications until he returns

## 2017-01-29 NOTE — Telephone Encounter (Signed)
Called with below message to wife, verbalized understanding.

## 2017-02-15 MED FILL — ?LISINOPRIL 5 MG TABLET: 5 | 30 days supply | Qty: 30 | Fill #2

## 2017-02-22 ENCOUNTER — Telehealth: Payer: Self-pay | Admitting: Hematology and Oncology

## 2017-02-22 ENCOUNTER — Other Ambulatory Visit (HOSPITAL_BASED_OUTPATIENT_CLINIC_OR_DEPARTMENT_OTHER): Payer: Self-pay

## 2017-02-22 ENCOUNTER — Ambulatory Visit (HOSPITAL_BASED_OUTPATIENT_CLINIC_OR_DEPARTMENT_OTHER): Payer: Self-pay | Admitting: Hematology and Oncology

## 2017-02-22 ENCOUNTER — Encounter: Payer: Self-pay | Admitting: Hematology and Oncology

## 2017-02-22 VITALS — BP 128/88 | HR 82 | Temp 98.8°F | Resp 18 | Ht 73.0 in | Wt 252.0 lb

## 2017-02-22 DIAGNOSIS — D751 Secondary polycythemia: Secondary | ICD-10-CM

## 2017-02-22 DIAGNOSIS — D471 Chronic myeloproliferative disease: Secondary | ICD-10-CM

## 2017-02-22 DIAGNOSIS — D473 Essential (hemorrhagic) thrombocythemia: Secondary | ICD-10-CM

## 2017-02-22 DIAGNOSIS — D72829 Elevated white blood cell count, unspecified: Secondary | ICD-10-CM

## 2017-02-22 LAB — CBC WITH DIFFERENTIAL/PLATELET
BASO%: 2.2 % — ABNORMAL HIGH (ref 0.0–2.0)
Basophils Absolute: 0.5 10*3/uL — ABNORMAL HIGH (ref 0.0–0.1)
EOS ABS: 0.5 10*3/uL (ref 0.0–0.5)
EOS%: 2.3 % (ref 0.0–7.0)
HEMATOCRIT: 49 % (ref 38.4–49.9)
HEMOGLOBIN: 14.8 g/dL (ref 13.0–17.1)
LYMPH%: 7.6 % — AB (ref 14.0–49.0)
MCH: 18.9 pg — ABNORMAL LOW (ref 27.2–33.4)
MCHC: 30.2 g/dL — AB (ref 32.0–36.0)
MCV: 62.5 fL — ABNORMAL LOW (ref 79.3–98.0)
MONO#: 0.9 10*3/uL (ref 0.1–0.9)
MONO%: 4 % (ref 0.0–14.0)
NEUT%: 83.9 % — ABNORMAL HIGH (ref 39.0–75.0)
NEUTROS ABS: 19.1 10*3/uL — AB (ref 1.5–6.5)
PLATELETS: 436 10*3/uL — AB (ref 140–400)
RBC: 7.84 10*6/uL — ABNORMAL HIGH (ref 4.20–5.82)
RDW: 22.2 % — ABNORMAL HIGH (ref 11.0–14.6)
WBC: 22.7 10*3/uL — AB (ref 4.0–10.3)
lymph#: 1.7 10*3/uL (ref 0.9–3.3)
nRBC: 0 % (ref 0–0)

## 2017-02-22 NOTE — Progress Notes (Signed)
[place] OFFICE PROGRESS NOTE  Patient Care Team: Alfonse Spruce, FNP as PCP - General (Family Medicine)  SUMMARY OF ONCOLOGIC HISTORY: He was found to have abnormal CBC on admission with leukocytosis, erythrocytosis but with normal platelet count on recent hospitalization. Polycythemia was suspected.   He denies fever or chills, but does have frequent night sweats. He has intermittent headaches. He denies chest pain or shortness of breath, but had frequent leg cramps. He had symptoms of erythromyalgias. Denies a history of arthritis, kidney disease or gout. He denies any vision changes. He denies testosterone use. Was never diagnosed with congenital heart disease.  He never suffer from diagnosis of blood clot but does have family history of stroke in his father and paternal uncle. He does not take any aspirin products.There is no prior diagnosis of obstructive sleep apnea. The patient denies weight loss or pruritus.The patient is an occasional smoker consuming 1 cigar every 4 or 5 days, but does work in a [employer] and is exposed to second hand smoking by his wife.  On admission in October 2015, his CBC showed a Hb of 22.4, Hct of 71.4 with a WBC of 17.2. platelets were normal. The patient had numerous phlebotomy session while hospitalized recently. 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  He was recommended regular phlebotomy and aspirin.  He was subsequently lost to follow-up On 11/15/2016, he resume follow-up and frequent phlebotomy On 01/08/2017, he was started on hydroxyurea 500 mg daily  INTERVAL HISTORY: Please see below for problem oriented charting. He returns with his wife They have reconcile from dispute He ran out of his prescription a week ago He missed his appointment recently He complained of excessive fatigue but has worked long hours recently He denies recent infection, cough or  fever. The patient denies any recent signs or symptoms of bleeding such as spontaneous epistaxis, hematuria or hematochezia.   REVIEW OF SYSTEMS:   Constitutional: Denies fevers, chills or abnormal weight loss Eyes: Denies blurriness of vision Ears, nose, mouth, throat, and face: Denies mucositis or sore throat Respiratory: Denies cough, dyspnea or wheezes Cardiovascular: Denies palpitation, chest discomfort or lower extremity swelling Gastrointestinal:  Denies nausea, heartburn or change in bowel habits Skin: Denies abnormal skin rashes Lymphatics: Denies new lymphadenopathy or easy bruising Neurological:Denies numbness, tingling or new weaknesses Behavioral/Psych: Mood is stable, no new changes  All other systems were reviewed with the patient and are negative.  I have reviewed the past medical history, past surgical history, social history and family history with the patient and they are unchanged from previous note.  ALLERGIES:  is allergic to adhesive [tape].  MEDICATIONS:  Current Outpatient Prescriptions  Medication Sig Dispense Refill  . acetaminophen (TYLENOL) 500 MG tablet Take 1,000 mg by mouth every 6 (six) hours as needed (For pain.).     Marland Kitchen aspirin 81 MG chewable tablet Chew 1 tablet (81 mg total) by mouth daily. 30 tablet 3  . Blood Glucose Monitoring Suppl (TRUE METRIX METER) w/Device KIT Check blood sugars fasting and at bedtime and record 1 kit 0  . glipiZIDE (GLUCOTROL) 5 MG tablet Take 1 tablet (5 mg total) by mouth daily before breakfast. 30 tablet 2  . glucose blood (TRUE METRIX BLOOD GLUCOSE TEST) test strip Use as instructed 100 each 12  . hydroxyurea (HYDREA) 500 MG capsule Take 1 capsule (500 mg total) by mouth daily. May take with food to minimize GI  side effects. 30 capsule 9  . ibuprofen (ADVIL,MOTRIN) 200 MG tablet Take 800 mg by mouth every 6 (six) hours as needed (For pain.).     Marland Kitchen lisinopril (PRINIVIL,ZESTRIL) 5 MG tablet Take 1 tablet (5 mg total) by  mouth daily. 90 tablet 3  . Melatonin 5 MG CAPS Take by mouth.    . metFORMIN (GLUCOPHAGE) 1000 MG tablet Take 1 tablet (1,000 mg total) by mouth 2 (two) times daily with a meal. 180 tablet 3  . TRUEPLUS LANCETS 28G MISC Check blood sugars fasting and at bedtime and record 100 each 1   No current facility-administered medications for this visit.     PHYSICAL EXAMINATION: ECOG PERFORMANCE STATUS: 1 - Symptomatic but completely ambulatory  Vitals:   02/22/17 1444  BP: 128/88  Pulse: 82  Resp: 18  Temp: 98.8 F (37.1 C)   Filed Weights   02/22/17 1444  Weight: 252 lb (114.3 kg)    GENERAL:alert, no distress and comfortable SKIN: skin color, texture, turgor are normal, no rashes or significant lesions EYES: normal, Conjunctiva are pink and non-injected, sclera clear Musculoskeletal:no cyanosis of digits and no clubbing  NEURO: alert & oriented x 3 with fluent speech, no focal motor/sensory deficits  LABORATORY DATA:  I have reviewed the data as listed    Component Value Date/Time   NA 137 11/13/2016 1227   K 4.2 11/13/2016 1227   CL 103 11/13/2016 1227   CO2 25 11/13/2016 1227   GLUCOSE 187 (H) 11/13/2016 1227   BUN 11 11/13/2016 1227   CREATININE 1.08 11/13/2016 1227   CALCIUM 8.6 (L) 11/13/2016 1227   PROT 7.1 09/05/2014 0641   ALBUMIN 3.4 (L) 09/05/2014 0641   AST 17 09/05/2014 0641   ALT 19 09/05/2014 0641   ALKPHOS 141 (H) 09/05/2014 0641   BILITOT 1.0 09/05/2014 0641   GFRNONAA >60 11/13/2016 1227   GFRAA >60 11/13/2016 1227    No results found for: SPEP, UPEP  Lab Results  Component Value Date   WBC 22.7 (H) 02/22/2017   NEUTROABS 19.1 (H) 02/22/2017   HGB 14.8 02/22/2017   HCT 49.0 02/22/2017   MCV 62.5 (L) 02/22/2017   PLT 436 (H) 02/22/2017      Chemistry      Component Value Date/Time   NA 137 11/13/2016 1227   K 4.2 11/13/2016 1227   CL 103 11/13/2016 1227   CO2 25 11/13/2016 1227   BUN 11 11/13/2016 1227   CREATININE 1.08 11/13/2016  1227      Component Value Date/Time   CALCIUM 8.6 (L) 11/13/2016 1227   ALKPHOS 141 (H) 09/05/2014 0641   AST 17 09/05/2014 0641   ALT 19 09/05/2014 0641   BILITOT 1.0 09/05/2014 0641       ASSESSMENT & PLAN:  Myeloproliferative disorder ([place]) He has excellent response of phlebotomy with acceptable hemoglobin level. Unfortunately, he has persistent leukocytosis and thrombocytosis. We discussed a trial of hydroxyurea. The patient has been on hydroxyurea for a month with improvement of his blood counts He is noted to be somewhat noncompliant and I reinforced compliance I recommend he continues hydroxyurea at 500 mg daily along with aspirin therapy I reinforced the importance of monthly blood work and I will see him back in 3 months   No orders of the defined types were placed in this encounter.  All questions were answered. The patient knows to call the clinic with any problems, questions or concerns. No barriers to learning was detected. I spent  10 minutes counseling the patient face to face. The total time spent in the appointment was 15 minutes and more than 50% was on counseling and review of test results     Heath Lark, MD 02/22/2017 3:08 PM

## 2017-02-22 NOTE — Telephone Encounter (Signed)
Scheduled appt per 7/19 klos - Gave patient AVS and calender per los.

## 2017-02-22 NOTE — Assessment & Plan Note (Signed)
He has excellent response of phlebotomy with acceptable hemoglobin level. Unfortunately, he has persistent leukocytosis and thrombocytosis. We discussed a trial of hydroxyurea. The patient has been on hydroxyurea for a month with improvement of his blood counts He is noted to be somewhat noncompliant and I reinforced compliance I recommend he continues hydroxyurea at 500 mg daily along with aspirin therapy I reinforced the importance of monthly blood work and I will see him back in 3 months

## 2017-02-23 MED FILL — HYDROXYUREA 500 MG CAPSULE: 500 | 30 days supply | Qty: 30 | Fill #1

## 2017-03-19 MED FILL — ?LISINOPRIL 5 MG TABLET: 5 | 30 days supply | Qty: 30 | Fill #0

## 2017-03-26 ENCOUNTER — Telehealth: Payer: Self-pay | Admitting: *Deleted

## 2017-03-26 ENCOUNTER — Other Ambulatory Visit (HOSPITAL_BASED_OUTPATIENT_CLINIC_OR_DEPARTMENT_OTHER): Payer: Self-pay

## 2017-03-26 ENCOUNTER — Other Ambulatory Visit: Payer: Self-pay

## 2017-03-26 DIAGNOSIS — D471 Chronic myeloproliferative disease: Secondary | ICD-10-CM

## 2017-03-26 LAB — CBC WITH DIFFERENTIAL/PLATELET
BASO%: 2 % (ref 0.0–2.0)
BASOS ABS: 0.3 10*3/uL — AB (ref 0.0–0.1)
EOS ABS: 0.4 10*3/uL (ref 0.0–0.5)
EOS%: 2.8 % (ref 0.0–7.0)
HCT: 49.1 % (ref 38.4–49.9)
HGB: 14.7 g/dL (ref 13.0–17.1)
LYMPH%: 11.1 % — ABNORMAL LOW (ref 14.0–49.0)
MCH: 19.3 pg — AB (ref 27.2–33.4)
MCHC: 29.9 g/dL — ABNORMAL LOW (ref 32.0–36.0)
MCV: 64.5 fL — AB (ref 79.3–98.0)
MONO#: 0.6 10*3/uL (ref 0.1–0.9)
MONO%: 4.4 % (ref 0.0–14.0)
NEUT#: 11.3 10*3/uL — ABNORMAL HIGH (ref 1.5–6.5)
NEUT%: 79.7 % — AB (ref 39.0–75.0)
NRBC: 0 % (ref 0–0)
PLATELETS: 268 10*3/uL (ref 140–400)
RBC: 7.61 10*6/uL — AB (ref 4.20–5.82)
RDW: 23.4 % — AB (ref 11.0–14.6)
WBC: 14.2 10*3/uL — ABNORMAL HIGH (ref 4.0–10.3)
lymph#: 1.6 10*3/uL (ref 0.9–3.3)

## 2017-03-26 LAB — TECHNOLOGIST REVIEW

## 2017-03-26 MED FILL — HYDROXYUREA 500 MG CAPSULE: 500 | 30 days supply | Qty: 30 | Fill #2

## 2017-03-26 NOTE — Telephone Encounter (Signed)
Left message with note below 

## 2017-03-26 NOTE — Telephone Encounter (Signed)
-----   Message from Heath Lark, MD sent at 03/26/2017 10:18 AM EDT ----- Regarding: labs Tell him all labs are better Continue same dose Hydrea ----- Message ----- From: Interface, Lab In Three Zero One Sent: 03/26/2017  10:13 AM To: Heath Lark, MD

## 2017-04-16 MED FILL — LISINOPRIL 5 MG TAB: 5 | 30 days supply | Qty: 30 | Fill #1

## 2017-04-25 MED FILL — HYDROXYUREA 500 MG CAPSULE: 500 | 30 days supply | Qty: 30 | Fill #3

## 2017-04-26 ENCOUNTER — Other Ambulatory Visit: Payer: Self-pay

## 2017-04-26 MED FILL — metFORMIN HCL 1000 MG TABS: 1000 | 90 days supply | Qty: 180 | Fill #1

## 2017-05-22 MED FILL — ?LISINOPRIL 5 MG TABLET: 5 | 30 days supply | Qty: 30 | Fill #2

## 2017-05-25 ENCOUNTER — Encounter: Payer: Self-pay | Admitting: Hematology and Oncology

## 2017-05-25 ENCOUNTER — Ambulatory Visit: Payer: Self-pay | Admitting: Hematology and Oncology

## 2017-05-25 ENCOUNTER — Other Ambulatory Visit: Payer: Self-pay

## 2017-05-25 MED FILL — HYDROXYUREA 500 MG CAPSULE: 500 | 30 days supply | Qty: 30 | Fill #4

## 2017-06-20 MED FILL — ?LISINOPRIL 5 MG TABLET: 5 | 30 days supply | Qty: 30 | Fill #3

## 2017-06-25 MED FILL — HYDROXYUREA 500 MG CAPSULE: 500 | 30 days supply | Qty: 30 | Fill #5

## 2017-07-23 MED FILL — ?LISINOPRIL 5 MG TABLET: 5 | 30 days supply | Qty: 30 | Fill #4

## 2017-08-01 MED FILL — HYDROXYUREA 500 MG CAPSULE: 500 | 30 days supply | Qty: 30 | Fill #6

## 2017-09-07 MED FILL — LISINOPRIL 5 MG TABLET: 5 | 30 days supply | Qty: 30 | Fill #5

## 2017-09-07 MED FILL — HYDROXYUREA 500 MG CAPSULE: 500 | 30 days supply | Qty: 30 | Fill #7

## 2017-09-10 MED FILL — metFORMIN HCL 1000 MG TABS: 1000 | 90 days supply | Qty: 180 | Fill #2

## 2017-09-17 ENCOUNTER — Emergency Department (HOSPITAL_COMMUNITY)
Admission: EM | Admit: 2017-09-17 | Discharge: 2017-09-17 | Disposition: A | Discharge status: Home / Self Care | Payer: Self-pay | Attending: Emergency Medicine | Admitting: Emergency Medicine

## 2017-09-17 ENCOUNTER — Other Ambulatory Visit: Payer: Self-pay

## 2017-09-17 ENCOUNTER — Encounter (HOSPITAL_COMMUNITY): Payer: Self-pay

## 2017-09-17 DIAGNOSIS — Z7982 Long term (current) use of aspirin: Secondary | ICD-10-CM | POA: Insufficient documentation

## 2017-09-17 DIAGNOSIS — Z7984 Long term (current) use of oral hypoglycemic drugs: Secondary | ICD-10-CM | POA: Insufficient documentation

## 2017-09-17 DIAGNOSIS — L02511 Cutaneous abscess of right hand: Secondary | ICD-10-CM | POA: Insufficient documentation

## 2017-09-17 DIAGNOSIS — L0291 Cutaneous abscess, unspecified: Secondary | ICD-10-CM

## 2017-09-17 DIAGNOSIS — Z79899 Other long term (current) drug therapy: Secondary | ICD-10-CM | POA: Insufficient documentation

## 2017-09-17 DIAGNOSIS — E119 Type 2 diabetes mellitus without complications: Secondary | ICD-10-CM | POA: Insufficient documentation

## 2017-09-17 MED ORDER — DOXYCYCLINE HYCLATE 100 MG PO CAPS: 100.0000 mg | ORAL_CAPSULE | Freq: Two times a day (BID) | ORAL | 0 refills | Status: DC

## 2017-09-17 MED ORDER — IBUPROFEN 200 MG PO TABS: 600.0000 mg | ORAL_TABLET | Freq: Once | ORAL | Status: DC

## 2017-09-17 MED ORDER — LIDOCAINE HCL (PF) 1 % IJ SOLN
30.0000 mL | Freq: Once | INTRAMUSCULAR | Status: AC
  Administered 2017-09-17: 30 mL
  Filled 2017-09-17: qty 30

## 2017-09-17 MED FILL — ?DOXYCYCLINE HYC 100MG CAP: 100 | 5 days supply | Qty: 10 | Fill #0

## 2017-09-17 NOTE — ED Provider Notes (Signed)
[facility] DEPT Provider Note   CSN: [id number] Arrival date & time: 09/17/17  1221     History   Chief Complaint Chief Complaint  Patient presents with  . finger swelling    HPI Robert Avila is a 37 y.o. male.  HPI 37 year old Caucasian male with no pertinent past medical history presents to the ED for evaluation of abscess to his right ring finger.  Patient states that approximately 5 days ago he noticed a pimple-like structure to the right ring finger that progressively became more red and swollen and painful.  States that he was near a [place] bus when this happened.  Unknown he was stuck by a branch or bitten by an insect.  Patient states that the pain has been worsening and will shoot up his hand occasionally.  He denies any pain with adduction extension of the finger however it is tight on the back of the finger when he flexes it.  Denies any associated systemic symptoms of fever, chills, paresthesias or weakness.  He has been taking Motrin for his pain with only little relief.  Nothing makes better.  Reports history of diabetes. Past Medical History:  Diagnosis Date  . Hypertension   . Polycythemia     Patient Active Problem List   Diagnosis Date Noted  . Type 2 diabetes mellitus without complication, without long-term current use of insulin ([place]) 11/20/2016  . Hyperglycemia 11/16/2016  . Diabetes ([place]) 11/16/2016  . Non compliance with medical treatment 11/15/2016  . HTN (hypertension) 09/05/2014  . Lower back pain 09/04/2014  . Leg pain 09/04/2014  . Hip pain   . Polycythemia   . Myeloproliferative disorder ([place]) 06/01/2014  . Acute gouty arthritis 06/01/2014  . Sebaceous cyst 06/01/2014  . Polycythemia, secondary 05/27/2014  . Cellulitis of scrotum 05/24/2014    Past Surgical History:  Procedure Laterality Date  . DENTAL SURGERY         Home Medications    Prior to Admission medications   Medication Sig Start Date End  Date Taking? Authorizing Provider  acetaminophen (TYLENOL) 500 MG tablet Take 1,000 mg by mouth every 6 (six) hours as needed (For pain.).     [provider]  aspirin 81 MG chewable tablet Chew 1 tablet (81 mg total) by mouth daily. 06/01/14   Dhungel, Flonnie Overman, MD  Blood Glucose Monitoring Suppl (TRUE METRIX METER) w/Device KIT Check blood sugars fasting and at bedtime and record 11/16/16   Argentina Donovan, PA-C  doxycycline (VIBRAMYCIN) 100 MG capsule Take 1 capsule (100 mg total) by mouth 2 (two) times daily. 09/17/17   Doristine Devoid, PA-C  glipiZIDE (GLUCOTROL) 5 MG tablet Take 1 tablet (5 mg total) by mouth daily before breakfast. 12/13/16   Fredia Beets R, FNP  glucose blood (TRUE METRIX BLOOD GLUCOSE TEST) test strip Use as instructed 11/16/16   Argentina Donovan, PA-C  hydroxyurea (HYDREA) 500 MG capsule Take 1 capsule (500 mg total) by mouth daily. May take with food to minimize GI side effects. 01/08/17   Heath Lark, MD  ibuprofen (ADVIL,MOTRIN) 200 MG tablet Take 800 mg by mouth every 6 (six) hours as needed (For pain.).     [provider]  lisinopril (PRINIVIL,ZESTRIL) 5 MG tablet Take 1 tablet (5 mg total) by mouth daily. 11/16/16   Argentina Donovan, PA-C  Melatonin 5 MG CAPS Take by mouth.    [provider]  metFORMIN (GLUCOPHAGE) 1000 MG tablet Take 1 tablet (1,000 mg total)  by mouth 2 (two) times daily with a meal. 01/08/17   Heath Lark, MD  TRUEPLUS LANCETS 28G MISC Check blood sugars fasting and at bedtime and record 11/16/16   Argentina Donovan, PA-C    Family History Family History  Problem Relation Age of Onset  . Diabetes Father     Social History Social History   Tobacco Use  . Smoking status: Never Smoker  . Smokeless tobacco: Never Used  Substance Use Topics  . Alcohol use: Yes    Comment: occ  . Drug use: No     Allergies   Adhesive [tape]   Review of Systems Review of Systems  Constitutional: Negative for chills and  fever.  Gastrointestinal: Negative for vomiting.  Musculoskeletal: Positive for joint swelling and myalgias.  Skin: Positive for wound.  Neurological: Negative for weakness and numbness.     Physical Exam Updated Vital Signs BP (!) 165/94   Pulse 97   Temp 98.9 F (37.2 C)   Resp 17   Ht 6' 2"  (1.88 m)   Wt 113.4 kg (250 lb)   SpO2 99%   BMI 32.10 kg/m   Physical Exam  Constitutional: He appears well-developed and well-nourished. No distress.  HENT:  Head: Normocephalic and atraumatic.  Eyes: Right eye exhibits no discharge. Left eye exhibits no discharge. No scleral icterus.  Neck: Normal range of motion.  Pulmonary/Chest: No respiratory distress.  Musculoskeletal: Normal range of motion.  Grip strength equal bilaterally.  Full range of motion of the right ring finger with flexion extension of the DIP and PIP.  The abscess does not overlap the joint.  Radial pulses 2+ bilaterally.  Sensation intact.  Brisk cap refill.  Neurological: He is alert.  Skin: Skin is warm and dry. Capillary refill takes less than 2 seconds. No pallor.  She has approximately 2 x 2 centimeter area of induration, warmth and redness over the dorsal aspect of the right ring finger proximal over the phalanx.  There is also a 1 x 1 cm area of fluctuance noted.  Small pustule noted in the center of the area of induration.  There is no drainage noted.  It is tender to palpation.  There is no streaking up the arm or redness over the flexor tendon sheath.  Psychiatric: His behavior is normal. Judgment and thought content normal.  Nursing note and vitals reviewed.    ED Treatments / Results  Labs (all labs ordered are listed, but only abnormal results are displayed) Labs Reviewed - No data to display  EKG  EKG Interpretation None       Radiology No results found.  Procedures .Marland KitchenIncision and Drainage Date/Time: 09/17/2017 2:49 PM Performed by: Doristine Devoid, PA-C Authorized by: Doristine Devoid, PA-C   Consent:    Consent obtained:  Verbal   Consent given by:  Patient   Risks discussed:  Bleeding, damage to other organs, infection, incomplete drainage and pain   Alternatives discussed:  No treatment Location:    Type:  Abscess   Location:  Upper extremity   Upper extremity location:  Finger   Finger location:  R ring finger Pre-procedure details:    Skin preparation:  Betadine and antiseptic wash Anesthesia (see MAR for exact dosages):    Anesthesia method:  Local infiltration   Local anesthetic:  Lidocaine 1% w/o epi Procedure type:    Complexity:  Simple Procedure details:    Needle aspiration: yes     Incision types:  Cruciate  Incision depth:  Dermal   Scalpel blade:  11   Wound management:  Probed and deloculated and irrigated with saline   Drainage:  Purulent and bloody   Drainage amount:  Moderate   Wound treatment:  Wound left open   Packing materials:  None Post-procedure details:    Patient tolerance of procedure:  Tolerated well, no immediate complications   (including critical care time)  Medications Ordered in ED Medications  lidocaine (PF) (XYLOCAINE) 1 % injection 30 mL (not administered)  ibuprofen (ADVIL,MOTRIN) tablet 600 mg (not administered)     Initial Impression / Assessment and Plan / ED Course  I have reviewed the triage vital signs and the nursing notes.  Pertinent labs & imaging results that were available during my care of the patient were reviewed by me and considered in my medical decision making (see chart for details).     Patient with skin abscess amenable to incision and drainage.  Abscess was not large enough to warrant packing or drain,  wound recheck in 2 days. Encouraged home warm soaks and flushing.  Patient has no redness or pain over the flexor tendon that be concerning for flexor tenosynovitis.  No pain with range of motion of the joints and low suspicion for septic arthritis.  This seems to be a superficial  abscess that was treated with I&D and antibiotics.  I have encouraged patient to follow-up in 2 days for wound recheck.  Encourage patient to perform soaks at home.  Pt is hemodynamically stable, in NAD, & able to ambulate in the ED. Evaluation does not show pathology that would require ongoing emergent intervention or inpatient treatment. I explained the diagnosis to the patient. Pain has been managed & has no complaints prior to dc. Pt is comfortable with above plan and is stable for discharge at this time. All questions were answered prior to disposition. Strict return precautions for f/u to the ED were discussed. Encouraged follow up with PCP.   Final Clinical Impressions(s) / ED Diagnoses   Final diagnoses:  Abscess    ED Discharge Orders        Ordered    doxycycline (VIBRAMYCIN) 100 MG capsule  2 times daily     09/17/17 1445       Aaron Edelman 09/17/17 2221    Fatima Blank, MD 09/19/17 [phone]

## 2017-09-17 NOTE — Discharge Instructions (Signed)
You have been treated for an abscess in the ED.  Wound recheck in 2 days by ed or primary care doctor.   There are sign of surrounding infection. Please take all of your antibiotics until finished!   You may develop abdominal discomfort or diarrhea from the antibiotic. You may help offset this with probiotics which you can buy or get in yogurt.   May take tylenol and motrin as needed for pain. Warm compress to the area to help with the healing process. Avoid swimming for 1 week. May soak in warm water to help with pain and the healing process.   Follow up with your doctor, an urgent care, or return to ED in order to remove your packing in 48-72 hours. If you do not have packing return in 48-72 hours for wound recheck. Return to the emergency department if you develop a fever, your abscess appears to become more infected (growing surrounding redness and warmth), new or worsening symptoms develop, any additional concerns.   Abscess An abscess (boil or furuncle) is an infected area that contains a collection of pus.   SYMPTOMS Signs and symptoms of an abscess include pain, tenderness, redness, or hardness. You may feel a moveable soft area under your skin. An abscess can occur anywhere in the body.   TREATMENT  A surgical cut (incision) may be made over your abscess to drain the pus. Gauze may be packed into the space or a drain may be looped through the abscess cavity (pocket). This provides a drain that will allow the cavity to heal from the inside outwards. The abscess may be painful for a few days, but should feel much better if it was drained.  Your abscess, if seen early, may not have localized and may not have been drained. If not, another appointment may be required if it does not get better on its own or with medications.  HOME CARE INSTRUCTIONS  Keep the skin and clothes clean around your abscess.  If the abscess was drained, you will need to use gauze dressing to collect any draining pus.  Dressings will typically need to be changed 3 or more times a day.  The infection may spread by skin contact with others. Avoid skin contact as much as possible.  Practice good hygiene. This includes regular hand washing, cover any draining skin lesions, and do not share personal care items.  SEEK MEDICAL CARE IF:  You develop increased pain, swelling, redness, drainage, or bleeding in the wound site.  You develop signs of generalized infection including muscle aches, chills, fever, or a general ill feeling.  You have an oral temperature above 102 F (38.9 C).  MAKE SURE YOU:  Understand these instructions.  Will watch your condition.  Will get help right away if you are not doing well or get worse.  Document Released: 05/03/2005 Document Revised: 04/05/2011 Document Reviewed: 02/25/2008 [facility] Patient Information 2012 [place].

## 2017-09-17 NOTE — ED Triage Notes (Signed)
Patient reports finger swelling and redness to to the right ring finger x 4-5 days. Patient has pain that is shooting to the wrist.

## 2017-09-21 ENCOUNTER — Encounter: Payer: Self-pay | Admitting: Family Medicine

## 2017-09-21 ENCOUNTER — Ambulatory Visit: Payer: Self-pay | Attending: Family Medicine | Admitting: Family Medicine

## 2017-09-21 ENCOUNTER — Other Ambulatory Visit: Payer: Self-pay

## 2017-09-21 VITALS — BP 142/96 | HR 80 | Temp 98.3°F | Resp 12 | Ht 74.0 in | Wt 272.0 lb

## 2017-09-21 DIAGNOSIS — Z7984 Long term (current) use of oral hypoglycemic drugs: Secondary | ICD-10-CM | POA: Insufficient documentation

## 2017-09-21 DIAGNOSIS — I10 Essential (primary) hypertension: Secondary | ICD-10-CM

## 2017-09-21 DIAGNOSIS — L02511 Cutaneous abscess of right hand: Secondary | ICD-10-CM

## 2017-09-21 DIAGNOSIS — Z7982 Long term (current) use of aspirin: Secondary | ICD-10-CM | POA: Insufficient documentation

## 2017-09-21 DIAGNOSIS — E1165 Type 2 diabetes mellitus with hyperglycemia: Secondary | ICD-10-CM

## 2017-09-21 DIAGNOSIS — Z79899 Other long term (current) drug therapy: Secondary | ICD-10-CM | POA: Insufficient documentation

## 2017-09-21 LAB — GLUCOSE, POCT (MANUAL RESULT ENTRY): POC Glucose: 189 mg/dl — AB (ref 70–99)

## 2017-09-21 MED ORDER — DOXYCYCLINE HYCLATE 100 MG PO CAPS: 100.0000 mg | ORAL_CAPSULE | Freq: Two times a day (BID) | ORAL | 0 refills | Status: AC

## 2017-09-21 MED ORDER — SULFAMETHOXAZOLE-TRIMETHOPRIM 800-160 MG PO TABS: 1.0000 | ORAL_TABLET | Freq: Two times a day (BID) | ORAL | 0 refills | Status: AC

## 2017-09-21 MED ORDER — DOXYCYCLINE HYCLATE 100 MG PO CAPS: 100.0000 mg | ORAL_CAPSULE | Freq: Two times a day (BID) | ORAL | 0 refills | Status: DC

## 2017-09-21 MED ORDER — BACITRACIN-NEOMYCIN-POLYMYXIN 400-5-5000 EX OINT
TOPICAL_OINTMENT | Freq: Once | CUTANEOUS | Status: AC
  Administered 2017-09-21: 11:00:00 via TOPICAL

## 2017-09-21 MED ORDER — LISINOPRIL 10 MG PO TABS: 10.0000 mg | ORAL_TABLET | Freq: Every day | ORAL | 2 refills | Status: AC

## 2017-09-21 NOTE — Progress Notes (Signed)
Subjective:  Patient ID: Robert Avila, male    DOB: 04/27/1981  Age: 37 y.o. MRN: [id number]  CC: Follow-up; Hypertension; and Diabetes   HPI Robert Avila presents for hypertension and diabetes follow up. He was last seen in office in May 2018. History of hypertension: He is not exercising and is adherent to low salt diet.  Blood pressure is not well controlled at home. He is not taking anti-hypertensive. Cardiac symptoms none. Cardiovascular risk factors: diabetes mellitus, hypertension, male gender and sedentary lifestyle. Use of agents associated with hypertension: none. History of target organ damage: none. History of diabetes mellitus: Patient presents for follow up of diabetes. Symptoms: none.  Patient denies foot ulcerations, nausea, paresthesia of the feet, visual disturbances and vomitting.  Evaluation to date has been included: fasting blood sugar and hemoglobin A1C.  Home sugars: He does not check CBG's. Treatment to date: Continued metformin which has been somewhat effective.  History of ED visit 07/17/2018 for cellulitis of the right ring finger due to history of injury related to being stuck by a branch or bitten by an insect.  He denied any fevers, chills, paresthesias, or weakness of the hand.  I&D of the finger was performed and he was given a course of antibiotics to treat. He reports one more day of doxycyline treatment. He reports scant serous drainage with redness is still present.     Outpatient Medications Prior to Visit  Medication Sig Dispense Refill  . glucose blood (TRUE METRIX BLOOD GLUCOSE TEST) test strip Use as instructed 100 each 12  . hydroxyurea (HYDREA) 500 MG capsule Take 1 capsule (500 mg total) by mouth daily. May take with food to minimize GI side effects. 30 capsule 9  . ibuprofen (ADVIL,MOTRIN) 200 MG tablet Take 800 mg by mouth every 6 (six) hours as needed (For pain.).     Marland Kitchen Melatonin 5 MG CAPS Take by mouth.    . metFORMIN (GLUCOPHAGE) 1000 MG tablet Take  1 tablet (1,000 mg total) by mouth 2 (two) times daily with a meal. 180 tablet 3  . doxycycline (VIBRAMYCIN) 100 MG capsule Take 1 capsule (100 mg total) by mouth 2 (two) times daily. 10 capsule 0  . glipiZIDE (GLUCOTROL) 5 MG tablet Take 1 tablet (5 mg total) by mouth daily before breakfast. 30 tablet 2  . lisinopril (PRINIVIL,ZESTRIL) 5 MG tablet Take 1 tablet (5 mg total) by mouth daily. 90 tablet 3  . acetaminophen (TYLENOL) 500 MG tablet Take 1,000 mg by mouth every 6 (six) hours as needed (For pain.).     Marland Kitchen aspirin 81 MG chewable tablet Chew 1 tablet (81 mg total) by mouth daily. 30 tablet 3  . Blood Glucose Monitoring Suppl (TRUE METRIX METER) w/Device KIT Check blood sugars fasting and at bedtime and record 1 kit 0  . TRUEPLUS LANCETS 28G MISC Check blood sugars fasting and at bedtime and record 100 each 1   No facility-administered medications prior to visit.     ROS Review of Systems  Constitutional: Negative.   Eyes: Negative for visual disturbance.  Respiratory: Negative.   Cardiovascular: Negative.   Skin: Positive for wound.  Neurological: Negative for numbness.  Psychiatric/Behavioral: Negative for suicidal ideas.    Objective:  BP (!) 142/96 (BP Location: Right Arm, Patient Position: Sitting, Cuff Size: Large)   Pulse 80   Temp 98.3 F (36.8 C) (Oral)   Resp 12   Ht 6' 2"  (1.88 m)   Wt 272 lb (123.4 kg)  SpO2 96%   BMI 34.92 kg/m   BP/Weight 09/21/2017 09/17/2017 1/64/3539  Systolic BP 122 583 462  Diastolic BP 96 194 88  Wt. (Lbs) 272 250 252  BMI 34.92 32.1 33.25     Physical Exam  Eyes: Conjunctivae are normal. Pupils are equal, round, and reactive to light.  Neck: No JVD present.  Cardiovascular: Normal rate, regular rhythm, normal heart sounds and intact distal pulses.  Pulmonary/Chest: Effort normal and breath sounds normal.  Abdominal: Soft. Bowel sounds are normal.  Musculoskeletal:       Right hand: He exhibits swelling (mild to 4th digit of  right hand). Normal sensation noted.       Left hand: Normal sensation noted.  Skin: Skin is warm and dry. Lesion (abscess to right 4th digit, scant serous drainage present, mild localized swelling around wound bed) noted.  Nursing note and vitals reviewed.    Assessment & Plan:   1. Type 2 diabetes mellitus with hyperglycemia, without long-term current use of insulin (HCC)  - Glucose (CBG) - Hemoglobin A1c - Ambulatory referral to Ophthalmology - Lipid Panel - CMP and Liver  2. Abscess of right ring finger Wound cleansed with NS and ointment with dressing applied.  Given additional antibiotic course to help treat. - neomycin-bacitracin-polymyxin (NEOSPORIN) ointment - Apply dressing - sulfamethoxazole-trimethoprim (BACTRIM DS,SEPTRA DS) 800-160 MG tablet; Take 1 tablet by mouth 2 (two) times daily.  Dispense: 10 tablet; Refill: 0 - doxycycline (VIBRAMYCIN) 100 MG capsule; Take 1 capsule (100 mg total) by mouth 2 (two) times daily.  Dispense: 10 capsule; Refill: 0  3. Essential hypertension  - lisinopril (PRINIVIL,ZESTRIL) 10 MG tablet; Take 1 tablet (10 mg total) by mouth daily.  Dispense: 30 tablet; Refill: 2       Follow-up: Return in about 2 weeks (around 10/05/2017), or if symptoms worsen or fail to improve, for Wound abscess/ HTN.   Alfonse Spruce FNP

## 2017-09-21 NOTE — Progress Notes (Signed)
F/u on finger  Some drainage, clear 1 day of doxy

## 2017-09-21 NOTE — Patient Instructions (Signed)
Skin Abscess A skin abscess is an infected area on or under your skin that contains pus and other material. An abscess can happen almost anywhere on your body. Some abscesses break open (rupture) on their own. Most continue to get worse unless they are treated. The infection can spread deeper into the body and into your blood, which can make you feel sick. Treatment usually involves draining the abscess. Follow these instructions at home: Abscess Care  If you have an abscess that has not drained, place a warm, clean, wet washcloth over the abscess several times a day. Do this as told by your doctor.  Follow instructions from your doctor about how to take care of your abscess. Make sure you: ? Cover the abscess with a bandage (dressing). ? Change your bandage or gauze as told by your doctor. ? Wash your hands with soap and water before you change the bandage or gauze. If you cannot use soap and water, use hand sanitizer.  Check your abscess every day for signs that the infection is getting worse. Check for: ? More redness, swelling, or pain. ? More fluid or blood. ? Warmth. ? More pus or a bad smell. Medicines   Take over-the-counter and prescription medicines only as told by your doctor.  If you were prescribed an antibiotic medicine, take it as told by your doctor. Do not stop taking the antibiotic even if you start to feel better. General instructions  To avoid spreading the infection: ? Do not share personal care items, towels, or hot tubs with others. ? Avoid making skin-to-skin contact with other people.  Keep all follow-up visits as told by your doctor. This is important. Contact a doctor if:  You have more redness, swelling, or pain around your abscess.  You have more fluid or blood coming from your abscess.  Your abscess feels warm when you touch it.  You have more pus or a bad smell coming from your abscess.  You have a fever.  Your muscles ache.  You have  chills.  You feel sick. Get help right away if:  You have very bad (severe) pain.  You see red streaks on your skin spreading away from the abscess. This information is not intended to replace advice given to you by your health care provider. Make sure you discuss any questions you have with your health care provider. Document Released: 01/10/2008 Document Revised: 03/19/2016 Document Reviewed: 06/02/2015 Elsevier Interactive Patient Education  2018 [employer]  

## 2017-09-22 LAB — CMP AND LIVER
ALBUMIN: 4.5 g/dL (ref 3.5–5.5)
ALK PHOS: 177 IU/L — AB (ref 39–117)
ALT: 42 IU/L (ref 0–44)
AST: 30 IU/L (ref 0–40)
BILIRUBIN, DIRECT: 0.15 mg/dL (ref 0.00–0.40)
BUN: 22 mg/dL — ABNORMAL HIGH (ref 6–20)
Bilirubin Total: 0.6 mg/dL (ref 0.0–1.2)
CALCIUM: 10 mg/dL (ref 8.7–10.2)
CO2: 22 mmol/L (ref 20–29)
Chloride: 98 mmol/L (ref 96–106)
Creatinine, Ser: 1.13 mg/dL (ref 0.76–1.27)
GFR calc non Af Amer: 83 mL/min/{1.73_m2} (ref >59)
GFR, EST AFRICAN AMERICAN: 95 mL/min/{1.73_m2} (ref >59)
Glucose: 141 mg/dL — ABNORMAL HIGH (ref 65–99)
POTASSIUM: 5.6 mmol/L — AB (ref 3.5–5.2)
SODIUM: 136 mmol/L (ref 134–144)
TOTAL PROTEIN: 7.2 g/dL (ref 6.0–8.5)

## 2017-09-22 LAB — HEMOGLOBIN A1C
ESTIMATED AVERAGE GLUCOSE: 197 mg/dL
HEMOGLOBIN A1C: 8.5 % — AB (ref 4.8–5.6)

## 2017-09-22 LAB — LIPID PANEL
CHOLESTEROL TOTAL: 159 mg/dL (ref 100–199)
Chol/HDL Ratio: 4.4 ratio (ref 0.0–5.0)
HDL: 36 mg/dL — ABNORMAL LOW (ref >39)
LDL Calculated: 75 mg/dL (ref 0–99)
Triglycerides: 238 mg/dL — ABNORMAL HIGH (ref 0–149)
VLDL Cholesterol Cal: 48 mg/dL — ABNORMAL HIGH (ref 5–40)

## 2017-09-28 ENCOUNTER — Other Ambulatory Visit: Payer: Self-pay | Admitting: Family Medicine

## 2017-09-28 ENCOUNTER — Telehealth: Payer: Self-pay | Admitting: Family Medicine

## 2017-09-28 DIAGNOSIS — E782 Mixed hyperlipidemia: Principal | ICD-10-CM

## 2017-09-28 DIAGNOSIS — E1169 Type 2 diabetes mellitus with other specified complication: Secondary | ICD-10-CM

## 2017-09-28 DIAGNOSIS — E1165 Type 2 diabetes mellitus with hyperglycemia: Secondary | ICD-10-CM

## 2017-09-28 MED ORDER — PRAVASTATIN SODIUM 40 MG PO TABS: 40.0000 mg | ORAL_TABLET | Freq: Every day | ORAL | 2 refills | Status: AC

## 2017-09-28 MED ORDER — GLIPIZIDE 5 MG PO TABS: 5.0000 mg | ORAL_TABLET | Freq: Every day | ORAL | 2 refills | Status: AC

## 2017-09-28 NOTE — Telephone Encounter (Signed)
Called to speak with patient regarding results. No answer, voicemail left for call back.

## 2017-10-01 ENCOUNTER — Telehealth: Payer: Self-pay | Admitting: *Deleted

## 2017-10-01 NOTE — Telephone Encounter (Signed)
Left message on voicemail to return call.    Robert Spruce, FNP  Carilyn Goodpasture, RN        -Potassium level is elevated. High potassium could increase risk of arrhythmias. Recommend walk in lab only visit to recheck potassium levels within a week.  Diabetic screening shows HgbA1c is 8.5, goal is 7 or less.  You will be prescribed glipizide to help lower blood sugars. Continue to take your metformin. Check blood sugars regularly.  Lipid levels were elevated. This can increase your risk of heart disease overtime. You will be prescribed pravastatin to help lower risk. Start eating a diet low in saturated fat. Limit your intake of fried foods, red meats, and whole milk. Increase physical activity.  Recommend follow up in 6 weeks.

## 2017-10-04 NOTE — Telephone Encounter (Signed)
2nd Attempt- Unable to reach patient. Left message on voicemail and contact for [facility].

## 2017-10-08 NOTE — Telephone Encounter (Signed)
Unable to reach. Busy signal.

## 2017-10-08 NOTE — Telephone Encounter (Signed)
Left message on voice mail  to call back

## 2017-10-09 NOTE — Telephone Encounter (Signed)
Attempt to  Contact patient. Left message on voicemail. Unable to reach patient.

## 2017-10-16 ENCOUNTER — Ambulatory Visit: Payer: Self-pay | Admitting: Internal Medicine

## 2017-10-23 MED FILL — LISINOPRIL 5 MG TABLET: 5 | 30 days supply | Qty: 30 | Fill #6
